# Patient Record
Sex: Female | Born: 1970 | Hispanic: Yes | Marital: Single | Smoking: Never smoker
Health system: Southern US, Community
[De-identification: ages and names within clinical notes are randomized; demographics above are authoritative.]

---

## 2004-11-14 ENCOUNTER — Emergency Department (HOSPITAL_COMMUNITY): Admission: EM | Admit: 2004-11-14 | Discharge: 2004-11-14 | Payer: Self-pay | Admitting: Emergency Medicine

## 2004-11-21 ENCOUNTER — Emergency Department (HOSPITAL_COMMUNITY): Admission: EM | Admit: 2004-11-21 | Discharge: 2004-11-21 | Payer: Self-pay | Admitting: Emergency Medicine

## 2006-12-11 ENCOUNTER — Ambulatory Visit: Payer: Self-pay | Admitting: Gynecology

## 2006-12-23 ENCOUNTER — Ambulatory Visit: Payer: Self-pay | Admitting: Gynecology

## 2006-12-23 ENCOUNTER — Other Ambulatory Visit: Admission: RE | Admit: 2006-12-23 | Discharge: 2006-12-23 | Payer: Self-pay | Admitting: Obstetrics and Gynecology

## 2007-01-06 ENCOUNTER — Ambulatory Visit: Payer: Self-pay | Admitting: Obstetrics & Gynecology

## 2007-07-07 ENCOUNTER — Ambulatory Visit: Payer: Self-pay | Admitting: Obstetrics and Gynecology

## 2007-07-07 ENCOUNTER — Encounter: Payer: Self-pay | Admitting: Obstetrics and Gynecology

## 2007-12-29 ENCOUNTER — Ambulatory Visit: Payer: Self-pay | Admitting: Obstetrics and Gynecology

## 2007-12-29 ENCOUNTER — Encounter: Payer: Self-pay | Admitting: Obstetrics and Gynecology

## 2010-06-07 ENCOUNTER — Encounter (INDEPENDENT_AMBULATORY_CARE_PROVIDER_SITE_OTHER): Payer: Self-pay | Admitting: Nurse Practitioner

## 2010-06-26 ENCOUNTER — Ambulatory Visit
Admission: RE | Admit: 2010-06-26 | Discharge: 2010-06-26 | Payer: Self-pay | Source: Home / Self Care | Attending: Nurse Practitioner | Admitting: Nurse Practitioner

## 2010-06-26 DIAGNOSIS — S99919A Unspecified injury of unspecified ankle, initial encounter: Secondary | ICD-10-CM

## 2010-06-26 DIAGNOSIS — S8990XA Unspecified injury of unspecified lower leg, initial encounter: Secondary | ICD-10-CM | POA: Insufficient documentation

## 2010-06-26 DIAGNOSIS — S99929A Unspecified injury of unspecified foot, initial encounter: Secondary | ICD-10-CM

## 2010-07-01 ENCOUNTER — Ambulatory Visit (HOSPITAL_COMMUNITY)
Admission: RE | Admit: 2010-07-01 | Discharge: 2010-07-01 | Payer: Self-pay | Source: Home / Self Care | Attending: Internal Medicine | Admitting: Internal Medicine

## 2010-07-03 ENCOUNTER — Encounter
Admission: RE | Admit: 2010-07-03 | Discharge: 2010-07-23 | Payer: Self-pay | Source: Home / Self Care | Attending: Internal Medicine | Admitting: Internal Medicine

## 2010-07-09 ENCOUNTER — Encounter (INDEPENDENT_AMBULATORY_CARE_PROVIDER_SITE_OTHER): Payer: Self-pay | Admitting: Internal Medicine

## 2010-07-15 ENCOUNTER — Encounter (INDEPENDENT_AMBULATORY_CARE_PROVIDER_SITE_OTHER): Payer: Self-pay | Admitting: Internal Medicine

## 2010-07-24 ENCOUNTER — Encounter: Payer: Self-pay | Admitting: Physical Therapy

## 2010-07-25 NOTE — Letter (Signed)
Summary: *HSN Results Follow up  [facility]  [address] [place], [place] [address]   Phone: [phone]  Fax: [phone]      07/15/2010   Jamie MARQUITA LIAS [address] [place], [place]  [address]   Dear  Ms. Jamie Friedman,                            ____S.Drinkard,FNP   ____D. Gore,FNP       ____B. McPherson,MD   ____V. Rankins,MD    __X__E. Shalita Notte,MD    ____N. Daphine Deutscher, FNP  ____D. Reche Dixon, MD    ____K. Philipp Deputy, MD    ____Other     This letter is to inform you that your recent test(s):  _______Pap Smear    _______Lab Test     ____X___X-ray    __X_____ is within acceptable limits  _______ requires a medication change  _______ requires a follow-up lab visit  _______ requires a follow-up visit with your provider   Comments:  Xrays of your ankle were normal.  Please call if you have not heard from physical therapy       _________________________________________________________ If you have any questions, please contact our office                     Sincerely,  Julieanne Manson MD [facility]

## 2010-07-25 NOTE — Letter (Signed)
Summary: work/school disposition notice  work/school disposition notice   Imported By: Arta Bruce 06/27/2010 14:53:28  _____________________________________________________________________  External Attachment:    Type:   Image     Comment:   External Document

## 2010-07-25 NOTE — Miscellaneous (Signed)
Summary: Rehab Report//INITIAL SUMMARY  Rehab Report//INITIAL SUMMARY   Imported By: Arta Bruce 07/09/2010 15:57:05  _____________________________________________________________________  External Attachment:    Type:   Image     Comment:   External Document

## 2010-07-25 NOTE — Assessment & Plan Note (Signed)
Summary: *new pt//mc   Vital Signs:  Patient profile:   40 year old female Menstrual status:  regular LMP:     06/16/2010 Height:      63 inches Weight:      146.31 pounds BMI:     26.01 Temp:     97 degrees F oral Pulse rate:   78 / minute Pulse rhythm:   regular Resp:     16 per minute BP sitting:   112 / 70  (left arm) Cuff size:   regular  Vitals Entered By: Hale Drone CMA (June 26, 2010 2:21 PM) CC: Here to establish. Right foot injury at work on 10/11. Went to an Urgent Care and was sent to PT. Has been told she should be walking by now but she doesn't understand why since she is in pain. She can't bare to move her foot or leg.  Is Patient Diabetic? No Pain Assessment Patient in pain? yes     Location: right foot Intensity: 7 Type: numbness Onset of pain  Constant  Does patient need assistance? Functional Status Self care Ambulation Normal LMP (date): 06/16/2010     Menstrual Status regular Enter LMP: 06/16/2010   CC:  Here to establish. Right foot injury at work on 10/11. Went to an Urgent Care and was sent to PT. Has been told she should be walking by now but she doesn't understand why since she is in pain. She can't bare to move her foot or leg. Marland Kitchen  History of Present Illness: 40 yo female here to establish.  1.  Rgiht foot pain:  Injured fight foot at work 03/29/10--dropped meat boxes on lateral foot and ankle, turned foot in as hit.   Went to [facility] on [address].  Had Xrays that were reportedly normal.  Was told to ice and elevate.  Was referred for PT and had 4 visits.  Cannot recall where the PT was done.  Pt. states the PT helped, but still having pain.  Apparently, were recommending extending the therapy.  Has not heard back from [facility].    Has pain all the time.  If bears weight, hurts more.  Still using a single crutch to walk.  No history of previous injury to ankle.  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 LIves with boyfriend Another female friend of boyfriend lives with them All children in [place]oved [place] in [place]. Tobacco:  no Alcohol:  no Drugs:  no  Physical Exam  Msk:  Almost full ROM of right foot and ankle, though much more pain with eversion. Mild swelling of soft tissue over lateral ankle and foot.  NT over anterior ankle mortise.   No crepitation or erythema Good DP and PT pulses.   Impression & Recommendations:  Problem # 1:  ANKLE INJURY, RIGHT (ICD-959.7)  Send for another Xray  Orders: Diagnostic X-Ray/Fluoroscopy (Diagnostic X-Ray/Flu) Physical Therapy Referral (PT)  Complete Medication List: 1)  Ibuprofen 600 Mg Tabs (Ibuprofen) .Marland Kitchen.. 1 tab by mouth every 6 hours as needed for pain.  take with food  Patient Instructions: 1)  Follow up with Dr. Delrae Alfred in 3 months --ankle pain Prescriptions: IBUPROFEN 600 MG TABS (IBUPROFEN) 1 tab  by mouth every 6 hours as needed for pain.  Take with food  #90 x 0   Entered and Authorized by:   Julieanne Manson MD   Signed by:   Julieanne Manson MD on 06/26/2010   Method used:   Electronically to        [facility] [phone]* (retail)       [address]       [place], [place]  [address]       Ph: [phone]       Fax: [phone]   RxID:   [id number]    Orders Added: 1)  New Patient Level II [99202] 2)  Diagnostic X-Ray/Fluoroscopy [Diagnostic X-Ray/Flu] 3)  Physical Therapy Referral [PT]   Not Administered:    Influenza Vaccine not given due to: declined

## 2010-07-29 ENCOUNTER — Ambulatory Visit: Payer: Self-pay | Attending: Internal Medicine | Admitting: Rehabilitative and Restorative Service Providers"

## 2010-07-29 DIAGNOSIS — M25569 Pain in unspecified knee: Secondary | ICD-10-CM | POA: Insufficient documentation

## 2010-07-29 DIAGNOSIS — M25669 Stiffness of unspecified knee, not elsewhere classified: Secondary | ICD-10-CM | POA: Insufficient documentation

## 2010-07-29 DIAGNOSIS — M25579 Pain in unspecified ankle and joints of unspecified foot: Secondary | ICD-10-CM | POA: Insufficient documentation

## 2010-07-29 DIAGNOSIS — IMO0001 Reserved for inherently not codable concepts without codable children: Secondary | ICD-10-CM | POA: Insufficient documentation

## 2010-07-31 ENCOUNTER — Ambulatory Visit: Payer: Self-pay | Admitting: Rehabilitative and Restorative Service Providers"

## 2010-08-05 ENCOUNTER — Ambulatory Visit: Payer: Self-pay | Admitting: Rehabilitative and Restorative Service Providers"

## 2010-08-07 ENCOUNTER — Ambulatory Visit: Payer: Self-pay | Admitting: Rehabilitative and Restorative Service Providers"

## 2010-08-12 ENCOUNTER — Encounter: Payer: Self-pay | Admitting: Physical Therapy

## 2010-08-14 ENCOUNTER — Encounter: Payer: Self-pay | Admitting: Physical Therapy

## 2010-08-15 ENCOUNTER — Ambulatory Visit: Payer: Self-pay | Admitting: Rehabilitative and Restorative Service Providers"

## 2010-08-15 ENCOUNTER — Encounter (INDEPENDENT_AMBULATORY_CARE_PROVIDER_SITE_OTHER): Payer: Self-pay | Admitting: Internal Medicine

## 2010-08-19 ENCOUNTER — Ambulatory Visit: Payer: Self-pay | Admitting: Rehabilitative and Restorative Service Providers"

## 2010-08-20 NOTE — Miscellaneous (Signed)
Summary: RENEWAL SUMMARY  RENEWAL SUMMARY   Imported By: Arta Bruce 08/15/2010 15:02:28  _____________________________________________________________________  External Attachment:    Type:   Image     Comment:   External Document

## 2010-08-21 ENCOUNTER — Encounter: Payer: Self-pay | Admitting: Physical Therapy

## 2010-08-26 ENCOUNTER — Ambulatory Visit: Payer: Self-pay | Attending: Internal Medicine | Admitting: Physical Therapy

## 2010-08-26 DIAGNOSIS — M25569 Pain in unspecified knee: Secondary | ICD-10-CM | POA: Insufficient documentation

## 2010-08-26 DIAGNOSIS — IMO0001 Reserved for inherently not codable concepts without codable children: Secondary | ICD-10-CM | POA: Insufficient documentation

## 2010-08-26 DIAGNOSIS — M25579 Pain in unspecified ankle and joints of unspecified foot: Secondary | ICD-10-CM | POA: Insufficient documentation

## 2010-08-28 ENCOUNTER — Ambulatory Visit: Payer: Self-pay | Admitting: Rehabilitative and Restorative Service Providers"

## 2010-08-30 ENCOUNTER — Encounter: Payer: Self-pay | Admitting: Internal Medicine

## 2010-08-30 ENCOUNTER — Encounter (INDEPENDENT_AMBULATORY_CARE_PROVIDER_SITE_OTHER): Payer: Self-pay | Admitting: Internal Medicine

## 2010-08-30 DIAGNOSIS — K056 Periodontal disease, unspecified: Secondary | ICD-10-CM | POA: Insufficient documentation

## 2010-08-30 DIAGNOSIS — K146 Glossodynia: Secondary | ICD-10-CM | POA: Insufficient documentation

## 2010-08-30 DIAGNOSIS — K069 Disorder of gingiva and edentulous alveolar ridge, unspecified: Secondary | ICD-10-CM

## 2010-09-02 ENCOUNTER — Ambulatory Visit: Payer: Self-pay | Admitting: Rehabilitative and Restorative Service Providers"

## 2010-09-03 NOTE — Assessment & Plan Note (Signed)
Summary: f/u on right ankle pain   Vital Signs:  Patient profile:   40 year old female Menstrual status:  regular LMP:     08/17/2010 Weight:      146.44 pounds Temp:     97.9 degrees F oral Pulse rate:   67 / minute Pulse rhythm:   regular Resp:     16 per minute BP sitting:   116 / 63  (left arm) Cuff size:   regular  Vitals Entered By: Hale Drone CMA (August 30, 2010 11:20 AM) CC: f/u on right ankle. Feels it has improved. Her ankle will swell in she walks for long periods. Has a PT appt. on 09/02/10..... Needing dental referral.  Is Patient Diabetic? No Pain Assessment Patient in pain? yes     Location: right ankle Intensity: 6  Does patient need assistance? Functional Status Self care Ambulation Normal LMP (date): 08/17/2010     Enter LMP: 08/17/2010   CC:  f/u on right ankle. Feels it has improved. Her ankle will swell in she walks for long periods. Has a PT appt. on 09/02/10..... Needing dental referral. .  History of Present Illness: 1.  Right ankle and foot is better.  Still going to PT and has an appt. on the 12th.  Did not get the report that her ankle films were normal--discussed.  2.  Dental concerns:  Lower incisors and right upper lateral teeth hurting after a teeth cleaning with a dental clinic.  Does have some gingival bleeding in those areas, but only with brushing.  Does not floss.  3.  Sore throat for months--feels like blisters on tongue as well.  No symptoms of a cold to initiate this.  No antibiotic when started.  Does not chew or use tobacco in any way  Current Medications (verified): 1)  Ibuprofen 600 Mg Tabs (Ibuprofen) .Marland Kitchen.. 1 Tab By Mouth Every 6 Hours As Needed For Pain.  Take With Food  Allergies (verified): No Known Drug Allergies  Physical Exam  General:  NAD Mouth:  Significant periodontal disease with heavy tartar at gum line involving all teeth.  No injection or inflammation, discharge of throat or tongue--no lesions as well Neck:   No deformities, masses, or tenderness noted.   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  Problem # 3:  GLOSSODYNIA (ICD-529.6) No findings  Complete Medication List: 1)  Ibuprofen 600 Mg Tabs (Ibuprofen) .Marland Kitchen.. 1 tab by mouth every 6 hours as needed for pain.  take with food  Patient Instructions: 1)  Call when you develop blisters on your tongue so I can see at the time.   Orders Added: 1)  Est. Patient Level III [82956]

## 2010-11-05 NOTE — Group Therapy Note (Signed)
Jamie Friedman, Jamie Friedman NO.:  [id number]   MEDICAL RECORD NO.:  [id number]          PATIENT TYPE:  WOC   LOCATION:  [facility]                   FACILITY:  [facility]   PHYSICIAN:  Argentina Donovan, MD        DATE OF BIRTH:  1971/03/31   DATE OF SERVICE:  12/29/2007                                  CLINIC NOTE   REASON FOR VISIT:  Annual exam.   SUBJECTIVE:  Patient has no particularly concerns today that she would  like to address.  She does request a refill on her Ortho Tri-Cyclen  birth control.  She states that this has been going well for her.   OBJECTIVE:  VITAL SIGNS:  Temperature 97.6, pulse 72, blood pressure  105/63, weight 142.7 pounds, height 63-1/2 inches.  BREASTS:  Breast tissue reveals very fibrous and dense breast tissue but  no appreciated lumps on examination.  No expressible discharge.  GYNECOLOGIC:  Speculum was inserted and no suspicious lesions were noted  on the cervix.  Pap smear was performed.  There was a small amount of  mucus and blood in the cervical os consistent with the patient getting  ready to start her period.  Bimanual exam revealed no adnexal or  cervical motion tenderness.  Uterus was normal size and no adnexal  masses were appreciated.   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  Pap smear was performed and follow-up will be  determined based on these results.  Likely if this is normal, she will  need her next Pap in one year.  Her Ortho Tri-Cyclen was refilled for  one year.  She will need referral for mammography after her next visit.     ______________________________  Argentina Donovan, MD    ______________________________  Argentina Donovan, MD    PR/MEDQ  D:  12/29/2007  T:  12/29/2007  Job:  [id number]

## 2011-03-18 ENCOUNTER — Other Ambulatory Visit: Payer: Self-pay | Admitting: Obstetrics and Gynecology

## 2011-03-18 DIAGNOSIS — Z1231 Encounter for screening mammogram for malignant neoplasm of breast: Secondary | ICD-10-CM

## 2011-03-27 ENCOUNTER — Ambulatory Visit (HOSPITAL_COMMUNITY)
Admission: RE | Admit: 2011-03-27 | Discharge: 2011-03-27 | Disposition: A | Discharge status: Home / Self Care | Payer: Self-pay | Source: Ambulatory Visit | Attending: Obstetrics and Gynecology | Admitting: Obstetrics and Gynecology

## 2011-03-27 DIAGNOSIS — Z1231 Encounter for screening mammogram for malignant neoplasm of breast: Secondary | ICD-10-CM

## 2011-04-08 LAB — POCT PREGNANCY, URINE: Operator id: 234181

## 2012-05-24 ENCOUNTER — Encounter: Payer: Self-pay | Admitting: Obstetrics & Gynecology

## 2012-05-27 ENCOUNTER — Other Ambulatory Visit: Payer: Self-pay | Admitting: Obstetrics & Gynecology

## 2012-05-27 DIAGNOSIS — Z1231 Encounter for screening mammogram for malignant neoplasm of breast: Secondary | ICD-10-CM

## 2012-06-09 ENCOUNTER — Encounter: Payer: Self-pay | Admitting: Obstetrics & Gynecology

## 2012-06-17 ENCOUNTER — Ambulatory Visit (HOSPITAL_COMMUNITY)
Admission: RE | Admit: 2012-06-17 | Discharge: 2012-06-17 | Disposition: A | Discharge status: Home / Self Care | Payer: Self-pay | Source: Ambulatory Visit | Attending: Obstetrics & Gynecology | Admitting: Obstetrics & Gynecology

## 2012-06-17 DIAGNOSIS — Z1231 Encounter for screening mammogram for malignant neoplasm of breast: Secondary | ICD-10-CM

## 2012-06-30 ENCOUNTER — Other Ambulatory Visit (HOSPITAL_COMMUNITY)
Admission: RE | Admit: 2012-06-30 | Discharge: 2012-06-30 | Disposition: A | Discharge status: Home / Self Care | Payer: Self-pay | Source: Ambulatory Visit | Attending: Obstetrics & Gynecology | Admitting: Obstetrics & Gynecology

## 2012-06-30 ENCOUNTER — Encounter: Payer: Self-pay | Admitting: Obstetrics & Gynecology

## 2012-06-30 ENCOUNTER — Ambulatory Visit (INDEPENDENT_AMBULATORY_CARE_PROVIDER_SITE_OTHER): Payer: Self-pay | Admitting: Obstetrics & Gynecology

## 2012-06-30 ENCOUNTER — Encounter: Payer: Self-pay | Admitting: *Deleted

## 2012-06-30 VITALS — BP 108/70 | HR 71 | Temp 97.9°F | Resp 20 | Ht 63.5 in | Wt 141.4 lb

## 2012-06-30 DIAGNOSIS — N841 Polyp of cervix uteri: Secondary | ICD-10-CM

## 2012-06-30 DIAGNOSIS — R87619 Unspecified abnormal cytological findings in specimens from cervix uteri: Secondary | ICD-10-CM | POA: Insufficient documentation

## 2012-06-30 DIAGNOSIS — Z309 Encounter for contraceptive management, unspecified: Secondary | ICD-10-CM

## 2012-06-30 MED ORDER — NORGESTIMATE-ETH ESTRADIOL 0.25-35 MG-MCG PO TABS: 1.0000 | ORAL_TABLET | Freq: Every day | ORAL | Status: DC

## 2012-06-30 NOTE — Progress Notes (Signed)
42 y.o. [id number] [facility] cervical polyp seen on exam on 05/05/12.  Normal pap smear, negative GC/Chlam.  Patient is Spanish-speaking only, Spanish interpreter present for this encounter.  She is very nervous.  BP 108/70  Pulse 71  Temp 97.9 F (36.6 C) (Oral)  Resp 20  Ht 5' 3.5" (1.613 m)  Wt 141 lb 6.4 oz (64.139 kg)  BMI 24.66 kg/m2  LMP 06/14/2012 5 mm friable polypoid lesion noted in the cervical os.  CERVICAL POLYPECTOMY NOTE Risks of the biopsy including pain, bleeding, infection, inadequate specimen, and need for additional procedures were discussed. The patient stated understanding and agreed to undergo procedure today. Consent was signed,  time out performed.  The patient's cervix was prepped with Betadine.  Ring forceps were used to grasp the polypoid lesion and the polypoid lesions was removed by twisting it off its base.  Tissue sent to pathology for analysis.  Small bleeding was noted and hemostasis was achieved using silver nitrate sticks.  The patient tolerated the procedure well. Post-procedure instructions were given to the patient.  Of note, patient asked for prescription for OCPs. No history of thromboembolism or other contraindications to OCPs. She was told [facility] can [facility] for lower prices but she insisted she wanted a prescription.  Sprintec prescribed.

## 2012-06-30 NOTE — Patient Instructions (Signed)
Return to clinic for any scheduled appointments or for any gynecologic concerns as needed.   

## 2012-07-05 ENCOUNTER — Telehealth: Payer: Self-pay | Admitting: *Deleted

## 2012-07-05 NOTE — Telephone Encounter (Signed)
Message copied by Mannie Stabile on Mon Jul 05, 2012  4:05 PM ------      Message from: Jaynie Collins A      Created: Sat Jul 03, 2012 11:24 AM       Benign cervical polyp. Please call patient withy Spanish interpreter to inform her of results. No follow up needed.

## 2012-07-05 NOTE — Telephone Encounter (Signed)
Called patient and her number is disconnected.

## 2012-07-06 NOTE — Telephone Encounter (Signed)
Patient called today.  Using [employer], Vanessa [place] gave information on benign cervical polyp - no cancer.  Patient stated understanding and was encouraged to call for an appointment if further issues developed.

## 2012-07-06 NOTE — Telephone Encounter (Signed)
Alternate number found in referral notes from [facility] [phone]. Called patient at this number but got no answer, no voicemail.

## 2012-10-27 ENCOUNTER — Emergency Department (HOSPITAL_COMMUNITY): Payer: No Typology Code available for payment source

## 2012-10-27 ENCOUNTER — Emergency Department (INDEPENDENT_AMBULATORY_CARE_PROVIDER_SITE_OTHER): Payer: No Typology Code available for payment source

## 2012-10-27 ENCOUNTER — Encounter (HOSPITAL_COMMUNITY): Payer: Self-pay

## 2012-10-27 ENCOUNTER — Emergency Department (HOSPITAL_COMMUNITY)
Admission: EM | Admit: 2012-10-27 | Discharge: 2012-10-27 | Disposition: A | Discharge status: Home / Self Care | Payer: No Typology Code available for payment source | Source: Home / Self Care

## 2012-10-27 ENCOUNTER — Emergency Department (HOSPITAL_COMMUNITY)
Admission: EM | Admit: 2012-10-27 | Discharge: 2012-10-27 | Disposition: A | Discharge status: Home Health | Payer: No Typology Code available for payment source | Source: Home / Self Care

## 2012-10-27 DIAGNOSIS — M7662 Achilles tendinitis, left leg: Secondary | ICD-10-CM

## 2012-10-27 DIAGNOSIS — M7652 Patellar tendinitis, left knee: Secondary | ICD-10-CM

## 2012-10-27 DIAGNOSIS — M7650 Patellar tendinitis, unspecified knee: Secondary | ICD-10-CM

## 2012-10-27 DIAGNOSIS — M7660 Achilles tendinitis, unspecified leg: Secondary | ICD-10-CM

## 2012-10-27 MED ORDER — MELOXICAM 7.5 MG PO TABS: 7.5000 mg | ORAL_TABLET | Freq: Two times a day (BID) | ORAL | Status: AC

## 2012-10-27 NOTE — ED Notes (Signed)
Patient complains of left foot swollen for almost a week Has gotten worse

## 2012-10-27 NOTE — ED Provider Notes (Signed)
History     CSN: [id number]  Arrival date & time 10/27/12  1456   None     Chief Complaint  Patient presents with  . Foot Swelling   42 year old female who presents with left ankle pain and left knee pain. The patient complains of pain around her achilles  tendon. The patient denies any trauma to her left leg. Pain in her left ankle started 2 weeks ago and pain in her left knee started 2 days ago. The patient denies any prior history of pain in that leg. She has not tried any NSAIDs or anti-inflammatory medications or over-the-counter medications.  HPI  History reviewed. No pertinent past medical history.  History reviewed. No pertinent past surgical history.  No family history on file.  History  Substance Use Topics  . Smoking status: Never Smoker   . Smokeless tobacco: Not on file  . Alcohol Use: No    OB History   Grav Para Term Preterm Abortions TAB SAB Ect Mult Living   6 6 6       6       Review of Systems Ankle pain and left knee pain Allergies  Review of patient's allergies indicates no known allergies.  Home Medications   Current Outpatient Rx  Name  Route  Sig  Dispense  Refill  . meloxicam (MOBIC) 7.5 MG tablet   Oral   Take 1 tablet (7.5 mg total) by mouth 2 (two) times daily.   30 tablet   2   . norgestimate-ethinyl estradiol (ORTHO-CYCLEN,SPRINTEC,PREVIFEM) 0.25-35 MG-MCG tablet   Oral   Take 1 tablet by mouth daily.   1 Package   11     BP 103/44  Pulse 74  Temp(Src) 97.7 F (36.5 C) (Oral)  Resp 18  SpO2 100%  Physical Exam Neck: Normal range of motion. Neck supple. No JVD present. No thyromegaly present.  Cardiovascular: Normal rate, regular rhythm, normal heart sounds and intact distal pulses.  Pulmonary/Chest: Effort normal and breath sounds normal. No stridor. No respiratory distress. She has no wheezes. She has no rales. She exhibits tenderness.  Abdominal: Soft. Bowel sounds are normal. She exhibits no distension and no mass.  There is no tenderness. There is no rebound and no guarding Musculoskeletal. The patient has pain around her left heel he is tender with good range of motion of the left ankle. She has no obvious swelling erythema of any of her joints including her left knee. She is good range of motion of the left knee. She is able to flex and extend her knee without having any pain  ED Course  Procedures (including critical care time)  Labs Reviewed - No data to display No results found.   1. Achilles tendinitis, left   2. Patellar tendinitis of left knee       MDM  Obtain x-rays of her left ankle and left knee today Anti-inflammatory medications and meloxicam 7.5 mg twice a day for 2 weeks We'll followup in 2 weeks If pain persists and the patient will need orthotics consult as well as shoe inserts arch support. She may also benefit from an orthopedic referral for that her Solu-Medrol injections to the tendon        Richarda Overlie, MD 10/27/12 1528

## 2012-10-27 NOTE — ED Notes (Signed)
Patient  Had left earlier in day unable to do x ray at that time Here for ankle x ray and knee x ray

## 2012-10-28 ENCOUNTER — Telehealth (HOSPITAL_COMMUNITY): Payer: Self-pay

## 2012-10-28 NOTE — ED Notes (Signed)
Tried to call patient to let her know about her appt for MRI Not available and no machine

## 2012-11-02 ENCOUNTER — Ambulatory Visit (HOSPITAL_COMMUNITY): Admission: RE | Admit: 2012-11-02 | Payer: No Typology Code available for payment source | Source: Ambulatory Visit

## 2013-01-05 ENCOUNTER — Ambulatory Visit: Payer: Self-pay

## 2013-01-14 ENCOUNTER — Ambulatory Visit: Payer: No Typology Code available for payment source | Attending: Family Medicine | Admitting: Internal Medicine

## 2013-01-14 VITALS — BP 115/67 | HR 71 | Temp 98.2°F | Resp 16 | Ht 65.0 in | Wt 141.0 lb

## 2013-01-14 DIAGNOSIS — R109 Unspecified abdominal pain: Secondary | ICD-10-CM | POA: Insufficient documentation

## 2013-01-14 LAB — POCT URINALYSIS DIPSTICK
Bilirubin, UA: NEGATIVE
Glucose, UA: NEGATIVE
Nitrite, UA: NEGATIVE
Urobilinogen, UA: 0.2
pH, UA: 5.5

## 2013-01-14 LAB — POCT URINE PREGNANCY: Preg Test, Ur: NEGATIVE

## 2013-01-14 MED ORDER — OMEPRAZOLE 40 MG PO CPDR: 40.0000 mg | DELAYED_RELEASE_CAPSULE | Freq: Every day | ORAL | Status: DC

## 2013-01-14 NOTE — Progress Notes (Signed)
Patient ID: Jamie Friedman, female   DOB: 25-Apr-1971, 42 y.o.   MRN: [id number] Patient Demographics  Jamie Friedman, is a 42 y.o. female  YQM:[id number]  BMW:[id number]  DOB - 04/21/1971  Chief Complaint  Patient presents with  . Abdominal Pain  . Establish Care        Subjective:   Jamie Friedman today is here for a follow up visit. Patient has No headache, No chest pain - No Nausea, No new weakness tingling or numbness, No Cough - SOB.  Patient reports that she's been having abdominal pain, right upper quadrant and epigastric, worse after eating and night when she lays down, also some burning sensation in the chest.  Last menstrual period 12/13/12.   Objective:    Filed Vitals:   01/14/13 1026  BP: 115/67  Pulse: 71  Temp: 98.2 F (36.8 C)  TempSrc: Oral  Resp: 16  Height: 5\' 5"  (1.651 m)  Weight: 141 lb (63.957 kg)  SpO2: 98%     ALLERGIES:  No Known Allergies  PAST MEDICAL HISTORY: History reviewed. No pertinent past medical history.  Past surgical history: None   Social history: - Denies any smoking or drug use. Occasional alcohol.  MEDICATIONS AT HOME: Prior to Admission medications   Medication Sig Start Date End Date Taking? Authorizing Provider  norgestimate-ethinyl estradiol (ORTHO-CYCLEN,SPRINTEC,PREVIFEM) 0.25-35 MG-MCG tablet Take 1 tablet by mouth daily. 06/30/12   Tereso Newcomer, MD  omeprazole (PRILOSEC) 40 MG capsule Take 1 capsule (40 mg total) by mouth daily. 01/14/13   Ripudeep Jenna Luo, MD     Exam  General appearance :Awake, alert, NAD, Speech Clear.  HEENT: Atraumatic and Normocephalic, PERLA Neck: supple, no JVD. No cervical lymphadenopathy.  Chest: Clear to auscultation bilaterally, no wheezing, rales or rhonchi CVS: S1 S2 regular, no murmurs.  Abdomen: soft, NBS, NT, ND, no gaurding, rigidity or rebound. Extremities: no cyanosis or clubbing, B/L Lower Ext shows no edema Neurology: Awake alert, and oriented X  3, CN II-XII intact, Non focal Skin: No Rash or lesions Wounds:N/A    Data Review   Basic Metabolic Panel: No results found for this basename: NA, K, CL, CO2, GLUCOSE, BUN, CREATININE, CALCIUM, MG, PHOS,  in the last 168 hours Liver Function Tests: No results found for this basename: AST, ALT, ALKPHOS, BILITOT, PROT, ALBUMIN,  in the last 168 hours  CBC: No results found for this basename: WBC, NEUTROABS, HGB, HCT, MCV, PLT,  in the last 168 hours  ------------------------------------------------------------------------------------------------------------------ No results found for this basename: HGBA1C,  in the last 72 hours ------------------------------------------------------------------------------------------------------------------ No results found for this basename: CHOL, HDL, LDLCALC, TRIG, CHOLHDL, LDLDIRECT,  in the last 72 hours ------------------------------------------------------------------------------------------------------------------ No results found for this basename: TSH, T4TOTAL, FREET3, T3FREE, THYROIDAB,  in the last 72 hours ------------------------------------------------------------------------------------------------------------------ No results found for this basename: VITAMINB12, FOLATE, FERRITIN, TIBC, IRON, RETICCTPCT,  in the last 72 hours  Coagulation profile  No results found for this basename: INR, PROTIME,  in the last 168 hours    Assessment & Plan   Active Problems: Abdominal pain: Epigastric/right upper quadrant worse on eating likely differential diagnosis gastritis/GERD/PUD or cholelithiasis - Advised patient to eat low fatty foods - Started patient on omeprazole 40 mg daily - Obtain abdominal ultrasound for further workup - Followup in one month for the ultrasound results and if improvement in her symptoms with omeprazole - Urine pregnancy test checked negative  Recommendations: Check CBC, BMET before next visit, labs ordered,  abdominal ultrasound ordered Health maintenance:  Will discuss next visit regarding PAP/immunizations. Follow-up in one month     RAI,RIPUDEEP M.D. 01/14/2013, 11:10 AM

## 2013-01-14 NOTE — Progress Notes (Signed)
Patient complains of lower abdomenal pain that started 3 months ago with fatigue sleepiness and states that she missed her last menstrual cycle and has been taking no birth control.

## 2013-02-14 ENCOUNTER — Encounter: Payer: Self-pay | Admitting: Internal Medicine

## 2013-02-14 ENCOUNTER — Ambulatory Visit: Payer: No Typology Code available for payment source | Attending: Family Medicine | Admitting: Internal Medicine

## 2013-02-14 ENCOUNTER — Ambulatory Visit: Payer: No Typology Code available for payment source

## 2013-02-14 VITALS — BP 101/66 | HR 76 | Temp 98.2°F | Resp 18 | Wt 144.0 lb

## 2013-02-14 DIAGNOSIS — R109 Unspecified abdominal pain: Secondary | ICD-10-CM

## 2013-02-14 DIAGNOSIS — K219 Gastro-esophageal reflux disease without esophagitis: Secondary | ICD-10-CM

## 2013-02-14 MED ORDER — DICYCLOMINE HCL 20 MG PO TABS: 20.0000 mg | ORAL_TABLET | Freq: Four times a day (QID) | ORAL | Status: DC

## 2013-02-14 MED ORDER — OMEPRAZOLE 40 MG PO CPDR: 40.0000 mg | DELAYED_RELEASE_CAPSULE | Freq: Every day | ORAL | Status: DC

## 2013-02-14 NOTE — Progress Notes (Signed)
Patient here for follow up-gerd

## 2013-02-14 NOTE — Progress Notes (Signed)
Patient ID: Jamie Friedman, female   DOB: 1971-06-15, 42 y.o.   MRN: [id number]  CC: Followup visit  HPI: Patient is a 42 years old woman with history of gastroesophageal reflux disease came in today for followup visit. No new complaint, she said that the abdominal pain is slightly better. 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 She denies chest pain, no cough, no shortness of breath. Her last menstrual period was the 28th of July, usually associated with some cramping, not related to this pain. No change in bowel habit, no urinary symptoms. No Known Allergies No past medical history on file. Current Outpatient Prescriptions on File Prior to Visit  Medication Sig Dispense Refill  . norgestimate-ethinyl estradiol (ORTHO-CYCLEN,SPRINTEC,PREVIFEM) 0.25-35 MG-MCG tablet Take 1 tablet by mouth daily.  1 Package  11   No current facility-administered medications on file prior to visit.   No family history on file. History   Social History  . Marital Status: Single    Spouse Name: N/A    Number of Children: N/A  . Years of Education: N/A   Occupational History  . Not on file.   Social History Main Topics  . Smoking status: Never Smoker   . Smokeless tobacco: Not on file  . Alcohol Use: No  . Drug Use: No  . Sexual Activity: Yes    Birth Control/ Protection: Condom   Other Topics Concern  . Not on file   Social History Narrative  . No narrative on file    Review of Systems: Constitutional: Negative for fever, chills, diaphoresis, activity change, appetite change and fatigue. HENT: Negative for ear pain, nosebleeds, congestion, facial swelling, rhinorrhea, neck pain, neck stiffness and ear discharge.  Eyes: Negative for pain, discharge, redness, itching and visual disturbance. Respiratory: Negative for cough, choking, chest tightness, shortness of breath, wheezing and stridor.  Cardiovascular: Negative for chest pain, palpitations and leg swelling. Gastrointestinal:  Negative for abdominal distention. Genitourinary: Negative for dysuria, urgency, frequency, hematuria, flank pain, decreased urine volume, difficulty urinating and dyspareunia.  Musculoskeletal: Negative for back pain, joint swelling, arthralgias and gait problem. Neurological: Negative for dizziness, tremors, seizures, syncope, facial asymmetry, speech difficulty, weakness, light-headedness, numbness and headaches.  Hematological: Negative for adenopathy. Does not bruise/bleed easily. Psychiatric/Behavioral: Negative for hallucinations, behavioral problems, confusion, dysphoric mood, decreased concentration and agitation.    Objective:   Filed Vitals:   02/14/13 0910  BP: 101/66  Pulse: 76  Temp: 98.2 F (36.8 C)  Resp: 18    Physical Exam: Constitutional: Patient appears well-developed and well-nourished. No distress. HENT: Normocephalic, atraumatic, External right and left ear normal. Oropharynx is clear and moist.  Eyes: Conjunctivae and EOM are normal. PERRLA, no scleral icterus. Neck: Normal ROM. Neck supple. No JVD. No tracheal deviation. No thyromegaly. CVS: RRR, S1/S2 +, no murmurs, no gallops, no carotid bruit.  Pulmonary: Effort and breath sounds normal, no stridor, rhonchi, wheezes, rales.  Abdominal: Soft. BS +,  no distension, tenderness, rebound or guarding.  Musculoskeletal: Normal range of motion. No edema and no tenderness.  Lymphadenopathy: No lymphadenopathy noted, cervical, inguinal or axillary Neuro: Alert. Normal reflexes, muscle tone coordination. No cranial nerve deficit. Skin: Skin is warm and dry. No rash noted. Not diaphoretic. No erythema. No pallor. Psychiatric: Normal mood and affect. Behavior, judgment, thought content normal.  No results found for this basename: WBC, HGB, HCT, MCV, PLT   No results found for this basename: CREATININE, BUN, NA, K, CL, CO2    No results found for  this basename: HGBA1C   Lipid Panel  No results found for this  basename: chol, trig, hdl, cholhdl, vldl, ldlcalc       Assessment and plan:   Patient Active Problem List   Diagnosis Date Noted  . GERD (gastroesophageal reflux disease) 02/14/2013  . Abdominal pain 02/14/2013  . Abdominal  pain, other specified site 01/14/2013  . PERIODONTAL DISEASE 08/30/2010  . GLOSSODYNIA 08/30/2010  . ANKLE INJURY, RIGHT 06/26/2010   Omeprazole 40 mg capsule by mouth daily Dicyclomine 20 mg tablet by mouth every 6 when necessary abdominal pain Patient counseled about nutrition and exercise  Complete urinalysis Abdominal and pelvic ultrasound Return to clinic in one month for follow up  Bolivar Haw was given clear instructions to go to ER or return to the clinic if symptoms don't improve, worsen or new problems develop.  Jamie Friedman verbalized understanding.  Jamie Friedman was told to call to get lab results if hasn't heard anything in the next week.         Jeanann Lewandowsky, MD [facility] [place], [place] [phone]   02/14/2013, 9:49 AM

## 2013-02-15 LAB — URINALYSIS, COMPLETE
Crystals: NONE SEEN
Ketones, ur: NEGATIVE mg/dL
Nitrite: POSITIVE — AB
Protein, ur: NEGATIVE mg/dL
Specific Gravity, Urine: 1.027 (ref 1.005–1.030)
Urobilinogen, UA: 0.2 mg/dL (ref 0.0–1.0)

## 2013-02-23 ENCOUNTER — Ambulatory Visit (HOSPITAL_COMMUNITY): Payer: No Typology Code available for payment source

## 2013-02-23 ENCOUNTER — Ambulatory Visit (HOSPITAL_COMMUNITY): Admission: RE | Admit: 2013-02-23 | Payer: No Typology Code available for payment source | Source: Ambulatory Visit

## 2013-02-28 ENCOUNTER — Ambulatory Visit (HOSPITAL_COMMUNITY): Payer: No Typology Code available for payment source

## 2013-03-03 ENCOUNTER — Ambulatory Visit (HOSPITAL_COMMUNITY)
Admission: RE | Admit: 2013-03-03 | Discharge: 2013-03-03 | Disposition: A | Discharge status: Home / Self Care | Payer: No Typology Code available for payment source | Source: Ambulatory Visit | Attending: Internal Medicine | Admitting: Internal Medicine

## 2013-03-03 ENCOUNTER — Telehealth: Payer: Self-pay

## 2013-03-03 DIAGNOSIS — R109 Unspecified abdominal pain: Secondary | ICD-10-CM | POA: Insufficient documentation

## 2013-03-03 DIAGNOSIS — N946 Dysmenorrhea, unspecified: Secondary | ICD-10-CM | POA: Insufficient documentation

## 2013-03-03 NOTE — Telephone Encounter (Signed)
Jamie Friedman Can u call patient and give her results  Thank you

## 2013-03-03 NOTE — Telephone Encounter (Signed)
Message copied by Lestine Mount on Thu Mar 03, 2013  1:42 PM ------      Message from: Jeanann Lewandowsky E      Created: Thu Mar 03, 2013  9:51 AM       Please call to inform patient that the abdominal ultrasound was normal ------

## 2013-03-03 NOTE — Telephone Encounter (Signed)
Jamie Friedman can you call patient and give her pap smear results Thank you

## 2013-03-03 NOTE — Telephone Encounter (Signed)
Message copied by Lestine Mount on Thu Mar 03, 2013  1:40 PM ------      Message from: Jeanann Lewandowsky E      Created: Thu Mar 03, 2013 12:35 PM       Please call patient to inform her that her abdominal ultrasound and transvaginal ultrasound showed thickening of the endometrial lining/uterine wall. This needs to be evaluated by a gynecologist       For this reason we'll refer her to gynecologist for biopsy of the uterine wall ------

## 2013-03-08 NOTE — Telephone Encounter (Signed)
Pt aware of the results  And the Dr need to put the gyn referral

## 2013-03-28 ENCOUNTER — Telehealth: Payer: Self-pay | Admitting: Internal Medicine

## 2013-03-28 ENCOUNTER — Telehealth: Payer: Self-pay | Admitting: Family Medicine

## 2013-03-28 NOTE — Telephone Encounter (Signed)
Pt called by spanish intepretor. No u/s results noted. Pt has f/u appt tomorrow, will discuss then.

## 2013-03-28 NOTE — Telephone Encounter (Signed)
We do not have patient's ultrasound result. Please call patient back to verify date of tests and where it was done

## 2013-03-28 NOTE — Telephone Encounter (Signed)
Patient has not been notified of ultrasound results from 03/03/13. Patient was told she would be given results three days after test. Please call patient with results. Patient speaks Spanish only.

## 2013-03-28 NOTE — Telephone Encounter (Signed)
Patient has not received results for ultrasound from 03/03/13. Patient was told results would be given three days after test. Please call patient with results. Patient only speaks [place].

## 2013-03-31 NOTE — Telephone Encounter (Signed)
Can you review [place] and advise  Patient has called for results

## 2013-03-31 NOTE — Telephone Encounter (Signed)
Arna Medici, Can you call the patient and let her know the abdominal US is normal Thank you

## 2013-03-31 NOTE — Telephone Encounter (Signed)
Abdominal US is negative, can we please let pt know. Thank you

## 2013-04-11 ENCOUNTER — Ambulatory Visit: Payer: No Typology Code available for payment source | Attending: Internal Medicine

## 2013-04-27 ENCOUNTER — Ambulatory Visit: Payer: No Typology Code available for payment source | Attending: Internal Medicine | Admitting: Internal Medicine

## 2013-04-27 ENCOUNTER — Encounter: Payer: Self-pay | Admitting: Internal Medicine

## 2013-04-27 VITALS — BP 103/67 | HR 68 | Temp 98.4°F | Resp 16 | Ht 65.0 in | Wt 142.0 lb

## 2013-04-27 DIAGNOSIS — Z0000 Encounter for general adult medical examination without abnormal findings: Secondary | ICD-10-CM

## 2013-04-27 DIAGNOSIS — R109 Unspecified abdominal pain: Secondary | ICD-10-CM

## 2013-04-27 DIAGNOSIS — K219 Gastro-esophageal reflux disease without esophagitis: Secondary | ICD-10-CM

## 2013-04-27 DIAGNOSIS — R102 Pelvic and perineal pain: Secondary | ICD-10-CM

## 2013-04-27 LAB — CBC WITH DIFFERENTIAL/PLATELET
Basophils Absolute: 0 10*3/uL (ref 0.0–0.1)
Basophils Relative: 0 % (ref 0–1)
Hemoglobin: 13.5 g/dL (ref 12.0–15.0)
Lymphs Abs: 2.6 10*3/uL (ref 0.7–4.0)
MCHC: 33.5 g/dL (ref 30.0–36.0)
Monocytes Relative: 7 % (ref 3–12)
Neutro Abs: 2.4 10*3/uL (ref 1.7–7.7)
Neutrophils Relative %: 45 % (ref 43–77)
RBC: 4.23 MIL/uL (ref 3.87–5.11)
WBC: 5.4 10*3/uL (ref 4.0–10.5)

## 2013-04-27 LAB — BASIC METABOLIC PANEL
CO2: 28 mEq/L (ref 19–32)
Glucose, Bld: 77 mg/dL (ref 70–99)
Potassium: 4.3 mEq/L (ref 3.5–5.3)
Sodium: 139 mEq/L (ref 135–145)

## 2013-04-27 LAB — TSH: TSH: 4.348 u[IU]/mL (ref 0.350–4.500)

## 2013-04-27 LAB — LIPID PANEL
Cholesterol: 163 mg/dL (ref 0–200)
HDL: 57 mg/dL (ref >39)
LDL Cholesterol: 93 mg/dL (ref 0–99)
Total CHOL/HDL Ratio: 2.9 Ratio

## 2013-04-27 NOTE — Progress Notes (Unsigned)
Patient ID: Jamie Friedman, female   DOB: [birth year], 42 y.o.   MRN: [id number]   HPI: This is a 42 year old female who comes in for a followup visit and test results. She was previously having abdominal pain an abdominal and pelvic ultrasound was ordered. She states that upper abdominal pain is no longer a major issue. She is not taking the below mentioned medications and occasionally takes a pill if she feels heartburn. In regards to pelvic pain she states it has also improved significantly.  No Known Allergies History reviewed. No pertinent past medical history. Prior to Admission medications   Medication Not taking  Sig Start Date End Date Taking? Authorizing Provider  dicyclomine (BENTYL) 20 MG tablet Take 1 tablet (20 mg total) by mouth every 6 (six) hours. 02/14/13   Jeanann Lewandowsky, MD  norgestimate-ethinyl estradiol (ORTHO-CYCLEN,SPRINTEC,PREVIFEM) 0.25-35 MG-MCG tablet Take 1 tablet by mouth daily. 06/30/12   Tereso Newcomer, MD  omeprazole (PRILOSEC) 40 MG capsule Take 1 capsule (40 mg total) by mouth daily. 02/14/13   Jeanann Lewandowsky, MD   History reviewed. No pertinent family history. History   Social History  . Marital Status: Single    Spouse Name: N/A    Number of Children: N/A  . Years of Education: N/A   Occupational History  . Not on file.   Social History Main Topics  . Smoking status: Never Smoker   . Smokeless tobacco: Not on file  . Alcohol Use: No  . Drug Use: No  . Sexual Activity: Yes    Birth Control/ Protection: Condom   Other Topics Concern  . Not on file   Social History Narrative  . No narrative on file    Review of Systems ______ Constitutional: Negative for fever, chills, diaphoresis, activity change, appetite change and fatigue. ____ HENT: Negative for ear pain, nosebleeds, congestion, facial swelling, rhinorrhea, neck pain, neck stiffness and ear discharge.  ____ Eyes: Negative for pain, discharge, redness, itching and visual  disturbance. ____ Respiratory: Negative for cough, choking, chest tightness, shortness of breath, wheezing and stridor.  ____ Cardiovascular: Negative for chest pain, palpitations and leg swelling. ____ Gastrointestinal: Negative for Nausea/ Vomiting/ Diarrhea or Consitpation Genitourinary: Negative for dysuria, urgency, frequency, hematuria, flank pain, decreased urine volume, difficulty urinating and dyspareunia. ____ Musculoskeletal: Negative for back pain, joint swelling, arthralgias and gait problem. ________ Neurological: Negative for dizziness, tremors, seizures, syncope, facial asymmetry, speech difficulty, weakness, light-headedness, numbness and headaches. ____ Hematological: Negative for adenopathy. Does not bruise/bleed easily. ____ Psychiatric/Behavioral: Negative for hallucinations, behavioral problems, confusion, dysphoric mood, decreased concentration and agitation. ______   Objective:   Filed Vitals:   04/27/13 0931  BP: 103/67  Pulse: 68  Temp: 98.4 F (36.9 C)  Resp: 16   Filed Weights   04/27/13 0931  Weight: 142 lb (64.411 kg)    Physical Exam ______ Constitutional: Appears well-developed and well-nourished. No distress. ____ HENT: Normocephalic. External right and left ear normal. Oropharynx is clear and moist. ____ Eyes: Conjunctivae and EOM are normal. PERRLA, no scleral icterus. ____ Neck: Normal ROM. Neck supple. No JVD. No tracheal deviation. No thyromegaly. ____ CVS: RRR, S1/S2 +, no murmurs, no gallops, no carotid bruit.  Pulmonary: Effort and breath sounds normal, no stridor, rhonchi, wheezes, rales.  Abdominal: Soft. BS +,  no distension, tenderness, rebound or guarding. ________ Musculoskeletal: Normal range of motion. No edema and no tenderness. ____ Neuro: Alert. Normal reflexes, muscle tone coordination. No cranial nerve deficit. Skin: Skin is warm and dry.  No rash noted. Not diaphoretic. No erythema. No pallor. ____ Psychiatric: Normal mood and  affect. Behavior, judgment, thought content normal. __  No results found for this basename: WBC, HGB, HCT, MCV, PLT   No results found for this basename: CREATININE, BUN, NA, K, CL, CO2    No results found for this basename: HGBA1C   Lipid Panel  No results found for this basename: chol, trig, hdl, cholhdl, vldl, ldlcalc        Patient Active Problem List   Diagnosis Date Noted  . GERD (gastroesophageal reflux disease) 02/14/2013  . Abdominal pain 02/14/2013  . Abdominal  pain, other specified site 01/14/2013  . PERIODONTAL DISEASE 08/30/2010  . GLOSSODYNIA 08/30/2010  . ANKLE INJURY, RIGHT 06/26/2010     Preventative Medicine:   Mammogram: Not yet due for this Pap Smear : Refer to GYN  Flu vaccine: Declined   LABS: Following have been ordered Ordered Metabolic panel: CBC:  Vitamin D : Lipid Panel: TSH:    Assessment and plan: Abdominal pain -Suspected to be from GERD -Continue to take when necessary medication as it is not severe  Pelvic pain -Ultrasound reveals hypertrophic endometrium for which I will refer to Gyn  Calvert Cantor, MD

## 2013-04-27 NOTE — Progress Notes (Unsigned)
Pt is here for a f/u visit. Pt last visit she was here with stomach pain. Today she is much better.

## 2013-04-28 LAB — VITAMIN D 25 HYDROXY (VIT D DEFICIENCY, FRACTURES): Vit D, 25-Hydroxy: 22 ng/mL — ABNORMAL LOW (ref 30–89)

## 2013-05-09 ENCOUNTER — Encounter (HOSPITAL_COMMUNITY): Payer: Self-pay | Admitting: Emergency Medicine

## 2013-05-09 ENCOUNTER — Emergency Department (HOSPITAL_COMMUNITY): Payer: No Typology Code available for payment source

## 2013-05-09 ENCOUNTER — Emergency Department (HOSPITAL_COMMUNITY)
Admission: EM | Admit: 2013-05-09 | Discharge: 2013-05-09 | Disposition: A | Discharge status: Home / Self Care | Payer: No Typology Code available for payment source | Attending: Emergency Medicine | Admitting: Emergency Medicine

## 2013-05-09 ENCOUNTER — Ambulatory Visit: Payer: No Typology Code available for payment source

## 2013-05-09 DIAGNOSIS — R079 Chest pain, unspecified: Secondary | ICD-10-CM | POA: Insufficient documentation

## 2013-05-09 DIAGNOSIS — M25519 Pain in unspecified shoulder: Secondary | ICD-10-CM | POA: Insufficient documentation

## 2013-05-09 DIAGNOSIS — R519 Headache, unspecified: Secondary | ICD-10-CM

## 2013-05-09 DIAGNOSIS — R51 Headache: Secondary | ICD-10-CM | POA: Insufficient documentation

## 2013-05-09 LAB — BASIC METABOLIC PANEL
CO2: 26 mEq/L (ref 19–32)
Calcium: 9.7 mg/dL (ref 8.4–10.5)
Chloride: 102 mEq/L (ref 96–112)
Glucose, Bld: 89 mg/dL (ref 70–99)
Potassium: 4.2 mEq/L (ref 3.5–5.1)
Sodium: 140 mEq/L (ref 135–145)

## 2013-05-09 LAB — CBC
Hemoglobin: 14.9 g/dL (ref 12.0–15.0)
MCH: 33.4 pg (ref 26.0–34.0)
MCV: 100 fL (ref 78.0–100.0)
RBC: 4.46 MIL/uL (ref 3.87–5.11)

## 2013-05-09 LAB — POCT I-STAT TROPONIN I

## 2013-05-09 MED ORDER — HYDROCODONE-ACETAMINOPHEN 5-325 MG PO TABS: 1.0000 | ORAL_TABLET | Freq: Four times a day (QID) | ORAL | Status: DC | PRN

## 2013-05-09 MED ORDER — IBUPROFEN 800 MG PO TABS
800.0000 mg | ORAL_TABLET | Freq: Once | ORAL | Status: AC
  Administered 2013-05-09: 800 mg via ORAL
  Filled 2013-05-09: qty 1

## 2013-05-09 NOTE — Progress Notes (Unsigned)
Pt comes in as walk-in with c/o 3 dys intermit left chest wall pain radiating to shoulder blade with left frontal headache/neck No distress noted 8/10 pain vss Report given to [facility]  Triage nurse

## 2013-05-09 NOTE — ED Notes (Signed)
Called times one no answer

## 2013-05-09 NOTE — ED Provider Notes (Signed)
CSN: [id number]     Arrival date & time 05/09/13  4098 History   First MD Initiated Contact with Patient 05/09/13 1148     Chief Complaint  Patient presents with  . Headache  . Chest Pain   (Consider location/radiation/quality/duration/timing/severity/associated sxs/prior Treatment) Patient is a 42 y.o. female presenting with headaches and chest pain. The history is provided by the patient (pt complains of a headache and some pain in left shoulder).  Headache Pain location:  Generalized Quality:  Dull Radiates to:  Does not radiate Severity currently:  4/10 Severity at highest:  5/10 Onset quality:  Gradual Timing:  Intermittent Progression:  Unchanged Chronicity:  New Context: not activity   Associated symptoms: no abdominal pain, no back pain, no congestion, no cough, no diarrhea, no fatigue, no seizures and no sinus pressure   Chest Pain Associated symptoms: headache   Associated symptoms: no abdominal pain, no back pain, no cough and no fatigue     History reviewed. No pertinent past medical history. History reviewed. No pertinent past surgical history. No family history on file. History  Substance Use Topics  . Smoking status: Never Smoker   . Smokeless tobacco: Not on file  . Alcohol Use: No   OB History   Grav Para Term Preterm Abortions TAB SAB Ect Mult Living   6 6 6       6      Review of Systems  Constitutional: Negative for appetite change and fatigue.  HENT: Negative for congestion, ear discharge and sinus pressure.   Eyes: Negative for discharge.  Respiratory: Negative for cough.   Cardiovascular: Positive for chest pain.  Gastrointestinal: Negative for abdominal pain and diarrhea.  Genitourinary: Negative for frequency and hematuria.  Musculoskeletal: Negative for back pain.  Skin: Negative for rash.  Neurological: Positive for headaches. Negative for seizures.  Psychiatric/Behavioral: Negative for hallucinations.    Allergies  Review of  patient's allergies indicates no known allergies.  Home Medications   Current Outpatient Rx  Name  Route  Sig  Dispense  Refill  . Multiple Vitamin (MULTIVITAMIN WITH MINERALS) TABS tablet   Oral   Take 1 tablet by mouth daily.         Marland Kitchen HYDROcodone-acetaminophen (NORCO/VICODIN) 5-325 MG per tablet   Oral   Take 1 tablet by mouth every 6 (six) hours as needed for moderate pain.   10 tablet   0    BP 113/58  Pulse 75  Temp(Src) 98 F (36.7 C) (Oral)  Resp 20  Wt 145 lb (65.772 kg)  SpO2 96%  LMP 05/03/2013 Physical Exam  Constitutional: She is oriented to person, place, and time. She appears well-developed.  HENT:  Head: Normocephalic.  Eyes: Conjunctivae and EOM are normal. No scleral icterus.  Neck: Neck supple. No thyromegaly present.  Cardiovascular: Normal rate and regular rhythm.  Exam reveals no gallop and no friction rub.   No murmur heard. Pulmonary/Chest: No stridor. She has no wheezes. She has no rales. She exhibits no tenderness.  Abdominal: She exhibits no distension. There is no tenderness. There is no rebound.  Musculoskeletal: Normal range of motion. She exhibits no edema.  Lymphadenopathy:    She has no cervical adenopathy.  Neurological: She is oriented to person, place, and time. She exhibits normal muscle tone. Coordination normal.  Skin: No rash noted. No erythema.  Psychiatric: She has a normal mood and affect. Her behavior is normal.    ED Course  Procedures (including critical care time) Labs Review  Labs Reviewed  CBC - Abnormal; Notable for the following:    Platelets 106 (*)    All other components within normal limits  BASIC METABOLIC PANEL - Abnormal; Notable for the following:    GFR calc non Af Amer 83 (*)    All other components within normal limits  POCT I-STAT TROPONIN I   Imaging Review Ct Head Wo Contrast  05/09/2013   CLINICAL DATA:  Three-day history of frontal headache  EXAM: CT HEAD WITHOUT CONTRAST  TECHNIQUE:  Contiguous axial images were obtained from the base of the skull through the vertex without intravenous contrast. Study was obtained within 24 hr of patient's arrival at the emergency department.  COMPARISON:  None.  FINDINGS: The ventricles are normal in size and configuration. There is no mass, hemorrhage, extra-axial fluid collection, or midline shift. There is a punctate calcification in the medial right frontal lobe which probably represents a small granuloma. The gray-white compartments are otherwise normal. There is no demonstrable acute infarct.  Bony calvarium appears intact.  Mastoid air cells are clear.  IMPRESSION: Probable small calcified granuloma in the right medial frontal lobe. No hemorrhage or mass. Elsewhere gray-white compartments are normal.   Electronically Signed   By: Bretta Bang M.D.   On: 05/09/2013 14:40    EKG Interpretation    Date/Time:  Monday May 09 2013 10:22:57 EST Ventricular Rate:  71 PR Interval:  98 QRS Duration: 82 QT Interval:  350 QTC Calculation: 380 R Axis:   83 Text Interpretation:  Sinus rhythm with short PR Otherwise normal ECG            MDM   1. Headache        Benny Lennert, MD 05/09/13 1515

## 2013-05-09 NOTE — ED Notes (Signed)
Pt discharged.Vital signs stable and GCS 15.Discharge instruction given. 

## 2013-05-09 NOTE — ED Notes (Signed)
Pt speaks Spanish and per interpretor phone patient is here with complaints with pain to left head, back, ear, and her left chest.  Pt was sent from clinic.  Pt reports 3 day of pain, headache started last nite, chest pain this am. Pt states intermittent left facial numbness.  No sob.  Pt reports pain to left eye sometimes

## 2013-06-06 ENCOUNTER — Ambulatory Visit (INDEPENDENT_AMBULATORY_CARE_PROVIDER_SITE_OTHER): Payer: No Typology Code available for payment source | Admitting: Obstetrics & Gynecology

## 2013-06-06 ENCOUNTER — Encounter: Payer: Self-pay | Admitting: Obstetrics & Gynecology

## 2013-06-06 VITALS — BP 95/61 | HR 72 | Temp 98.1°F | Ht 65.0 in | Wt 142.5 lb

## 2013-06-06 DIAGNOSIS — N946 Dysmenorrhea, unspecified: Secondary | ICD-10-CM

## 2013-06-06 MED ORDER — IBUPROFEN 600 MG PO TABS: 600.0000 mg | ORAL_TABLET | Freq: Four times a day (QID) | ORAL | Status: AC | PRN

## 2013-06-06 NOTE — Progress Notes (Signed)
Patient ID: Jamie Friedman, female   DOB: 1970-08-11, 42 y.o.   MRN: [id number]  Chief Complaint  Patient presents with  . Referral    from [facility]  . Gynecologic Exam    HPI Jamie Friedman is a 42 y.o. female.  Patient's last menstrual period was 06/03/2013. [id number]  Heavy menses on day 2 of her cycle with cramps. Regular menses, uses condoms, declines Mirena or OCP HPI  History reviewed. No pertinent past medical history.  History reviewed. No pertinent past surgical history.  History reviewed. No pertinent family history.  Social History History  Substance Use Topics  . Smoking status: Never Smoker   . Smokeless tobacco: Not on file  . Alcohol Use: No    No Known Allergies  Current Outpatient Prescriptions  Medication Sig Dispense Refill  . Multiple Vitamin (MULTIVITAMIN WITH MINERALS) TABS tablet Take 1 tablet by mouth daily.      Marland Kitchen ibuprofen (ADVIL,MOTRIN) 600 MG tablet Take 1 tablet (600 mg total) by mouth every 6 (six) hours as needed.  30 tablet  1   No current facility-administered medications for this visit.    Review of Systems Review of Systems  Constitutional: Negative.   Gastrointestinal: Negative for nausea and abdominal distention.  Genitourinary: Positive for menstrual problem. Negative for dysuria, vaginal bleeding and vaginal discharge.    Blood pressure 95/61, pulse 72, temperature 98.1 F (36.7 C), temperature source Oral, height 5\' 5"  (1.651 m), weight 142 lb 8 oz (64.638 kg), last menstrual period 06/03/2013.  Physical Exam Physical Exam  Constitutional: She appears well-developed. No distress.  Abdominal: Soft. There is no tenderness. There is no guarding.  Genitourinary: Vagina normal and uterus normal. No vaginal discharge found.  No mass, not tender    Data Reviewed CLINICAL DATA: Dysmenorrhea  EXAM:  TRANSABDOMINAL AND TRANSVAGINAL ULTRASOUND OF PELVIS  TECHNIQUE:  Both transabdominal and  transvaginal ultrasound examinations of the  pelvis were performed. Transabdominal technique was performed for  global imaging of the pelvis including uterus, ovaries, adnexal  regions, and pelvic cul-de-sac. It was necessary to proceed with  endovaginal exam following the transabdominal exam to visualize the  ovaries and adnexa to better advantage.  COMPARISON: None  FINDINGS:  Uterus  Measurements: 7.2 cm x 4.2 cm x 5.3 cm No fibroids or other mass  visualized.  Endometrium  Thickness: 17.3 mm endometrium is thickened with mixed echogenicity,  but no discrete mass and no endometrial fluid.  Right ovary  Measurements: 18 mm x 14 mm x 22 mm Normal appearance/no adnexal  mass.  Left ovary  Measurements: 18 mm x 14 mm x 25 mm Normal appearance/no adnexal  mass.  Other findings  Trace, presumed physiologic, free fluid.  IMPRESSION:  The endometrium is heterogeneous and thickened consistent with  endometrial hyperplasia. No discrete endometrial mass is seen.  Normal ovaries and adnexa.  Electronically Signed  By: Amie Portland  On: 03/03/2013 10:10   Assessment    Dysmenorrhea and menorrhagia, uses condoms, declines OCP or Mirena     Plan    Ibuprofen 600 mg Q 6-8 hr during menses  Report if not improved        Delaina Fetsch 06/06/2013, 2:13 PM

## 2013-06-06 NOTE — Patient Instructions (Signed)
Dismenorrea  (Dysmenorrhea)  Los cólicos menstruales (dismenorrea) se originan en los espasmos musculares del útero (contracciones) durante el período menstrual. En algunas mujeres, el malestar es sólo una molestia. En otras, la dismenorrea puede ser tan intensa que interfiere con las actividades diarias durante algunos días, todos los meses.  La dismenorrea primaria son los cólicos menstruales que duran algunos días al inicio de los períodos o poco después. En general se inician después de que la adolescente comienza a tener sus períodos. A medida que la mujer avanza en edad, o tiene un bebé, los cólicos generalmente disminuyen o desaparecen. La dismenorrea secundaria comienza en épocas posteriores de la vida, dura más tiempo, y el dolor puede ser más intenso. El dolor puede comenzar antes del período y durar hasta algunos días después.   CAUSAS   La causa de la dismenorrea generalmente es un problema subyacente, por ejemplo:  · El tejido que recubre interiormente al útero crece fuera del útero en otras áreas del cuerpo (endometriosis).  · The endometrial tissue, which normally lines the uterus, is found in or grows into the muscular walls of the uterus (adenomyosis).  · Los vasos sanguíneos de la pelvis se congestionan con sangre antes del período menstrual (síndrome congestivo pélvico).  · Crecimiento excesivo de las células (pólipos) del revestimiento interior del útero o del cuello uterino.  · Caída del útero (prolapso) debido a distensión o flojedad de los ligamentos.  · Depresión.  · Problemas como infección o inflamación en la vejiga.  · Problemas en el intestino, un tumor, o síndrome de colon irritable.  · Cáncer de los órganos femeninos o en la vejiga.  · Un útero muy inclinado.  · Una apertura muy estrecha o el cuello uterino cerrado.  · Tumores no cancerosos en el útero (fibromas).  · Enfermedad pélvica inflamatoria (EPI)  · Cicatrices en la pelvis (adherencias) por cirugías previas.  · Quiste  ovárico  · El uso del dispositivo intrauterino (DIU) como método anticonceptivo.  FACTORES DE RIESGO  Puede tener más riesgo de dismenorrea si:  · Tiene menos de 30 años.  · La pubertad se inició temprano.  · Tiene sangrado irregular o abundante.  · No ha tenido hijos.  · Tiene una historia familiar de este problema.  · Es fumadora.  SIGNOS Y SÍNTOMAS   · Cólicos y dolor punzante en la zona inferior del abdomen.  · Dolores de cabeza.  · Dolor de cintura.  · Náuseas o vómitos.  · Diarrea.  · Sudoración o mareos.  · Heces blandas.  DIAGNÓSTICO   El diagnóstico se basa en la historia personal, los síntomas, el examen físico, las pruebas diagnósticas o procedimientos. Las pruebas diagnósticas o procedimientos pueden ser:  · Análisis de sangre.  · Ecografías.  · Examen de las capas que cubren el útero (dilatación y curetaje, D&C).  · Examen de la zona interna del abdomen o la pelvis con un laparoscopio.  · Radiografías.  · Tomografía computada.  · Resonancia magnética.  · Un examen del interior de la vejiga con un citoscopio.  · Examen del interior del intestino o el estómago con un dispositivo especial (colonoscopio o un gastroscopio).  TRATAMIENTO   El tratamiento depende de la causa de la dismenorrea. El tratamiento puede incluir:  · Analgésicos indicados por su médico.  · Píldoras anticonceptivas o un DIU con progesterona.  · Terapia de reemplazo hormonal.  · Antiinflamatorios no esteroides (AINE). Los antiinflamatorios pueden detener la producción de prostaglandinas.  · Cirugía para extirpar   las adherencias, endometriosis, quiste de ovarios o fibromas.  · Extirpación del útero (histerectomía).  · Inyecciones de progesterona para detener el período menstrual.  · Corte de los nervios del sacro que van hacia los órganos femeninos (neurectomía presacra).  · Corriente eléctrica en los nervios sacros (estimulación de los nervios sacros).  · Medicamentos antidepresivos.  · Terapia psiquiátrica, psicoterapia individual o de  grupo.  · Actividad física y fisioterapia.  · Meditación y yoga.  · Acupuntura.  INSTRUCCIONES PARA EL CUIDADO EN EL HOGAR   · Tome sólo medicamentos de venta libre o recetados, según las indicaciones del médico.  · Coloque una almohadilla eléctrica o una botella con agua caliente en la zona inferior del abdomen. No duerma con la almohadilla térmica.  · Haga ejercicios aeróbicos como caminar, nadar, andar en bicicleta, para aliviar los cólicos.  · Masajear la zona inferior de la espalda o el abdomen puede ser de ayuda.  · Deje de fumar.  · Evite la cafeína y el alcohol.  SOLICITE ATENCIÓN MÉDICA SI:   · El dolor no mejora con los medicamentos recetados.  · Tiene dolor durante las relaciones sexuales.  · El dolor aumenta y no puede controlarlo con los medicamentos.  · Observa una hemorragia vaginal anormal con el período.  · Tiene náuseas o vómitos con el período menstrual que no puede controlar con medicamentos.  SOLICITE ATENCIÓN MÉDICA DE INMEDIATO SI:   Se desmaya.   Document Released: 03/19/2005 Document Revised: 02/09/2013  ExitCare® Patient Information ©2014 [employer].

## 2013-06-27 ENCOUNTER — Ambulatory Visit: Payer: No Typology Code available for payment source | Attending: Internal Medicine

## 2013-08-22 ENCOUNTER — Encounter: Payer: Self-pay | Admitting: Internal Medicine

## 2013-08-22 ENCOUNTER — Ambulatory Visit: Payer: No Typology Code available for payment source | Attending: Internal Medicine | Admitting: Internal Medicine

## 2013-08-22 VITALS — BP 112/68 | HR 77 | Temp 99.0°F | Resp 16 | Ht 65.0 in | Wt 138.0 lb

## 2013-08-22 DIAGNOSIS — K089 Disorder of teeth and supporting structures, unspecified: Secondary | ICD-10-CM | POA: Insufficient documentation

## 2013-08-22 DIAGNOSIS — K0889 Other specified disorders of teeth and supporting structures: Secondary | ICD-10-CM

## 2013-08-22 DIAGNOSIS — K029 Dental caries, unspecified: Secondary | ICD-10-CM

## 2013-08-22 DIAGNOSIS — Z09 Encounter for follow-up examination after completed treatment for conditions other than malignant neoplasm: Secondary | ICD-10-CM | POA: Insufficient documentation

## 2013-08-22 MED ORDER — IBUPROFEN 600 MG PO TABS: 600.0000 mg | ORAL_TABLET | Freq: Three times a day (TID) | ORAL | Status: DC | PRN

## 2013-08-22 MED ORDER — AMOXICILLIN-POT CLAVULANATE 875-125 MG PO TABS: 1.0000 | ORAL_TABLET | Freq: Two times a day (BID) | ORAL | Status: DC

## 2013-08-22 NOTE — Progress Notes (Signed)
MRN: [id number] Name: Jamie Friedman  Sex: female Age: 43 y.o. DOB: 04/11/1971  Allergies: Review of patient's allergies indicates no known allergies.  Chief Complaint  Patient presents with  . Follow-up    HPI: Patient is 43 y.o. female who comes today reported to have lot of dental pain for the last 3-4 days, she complaining of chills but denies any fever, she took over-the-counter pain medication with some improvement, she also has dental caries, requesting to see a dentist.  History reviewed. No pertinent past medical history.  History reviewed. No pertinent past surgical history.    Medication List       This list is accurate as of: 08/22/13  2:41 PM.  Always use your most recent med list.               amoxicillin-clavulanate 875-125 MG per tablet  Commonly known as:  AUGMENTIN  Take 1 tablet by mouth 2 (two) times daily.     ibuprofen 600 MG tablet  Commonly known as:  ADVIL,MOTRIN  Take 1 tablet (600 mg total) by mouth every 6 (six) hours as needed.     ibuprofen 600 MG tablet  Commonly known as:  ADVIL,MOTRIN  Take 1 tablet (600 mg total) by mouth every 8 (eight) hours as needed.     multivitamin with minerals Tabs tablet  Take 1 tablet by mouth daily.        Meds ordered this encounter  Medications  . amoxicillin-clavulanate (AUGMENTIN) 875-125 MG per tablet    Sig: Take 1 tablet by mouth 2 (two) times daily.    Dispense:  20 tablet    Refill:  0  . ibuprofen (ADVIL,MOTRIN) 600 MG tablet    Sig: Take 1 tablet (600 mg total) by mouth every 8 (eight) hours as needed.    Dispense:  30 tablet    Refill:  1     There is no immunization history on file for this patient.  History reviewed. No pertinent family history.  History  Substance Use Topics  . Smoking status: Never Smoker   . Smokeless tobacco: Not on file  . Alcohol Use: No    Review of Systems   As noted in HPI  Filed Vitals:   08/22/13 1428  BP: 112/68  Pulse:  77  Temp: 99 F (37.2 C)  Resp: 16    Physical Exam  Physical Exam  HENT:  Dental cavities  Eyes: EOM are normal. Pupils are equal, round, and reactive to light.  Cardiovascular: Normal rate and regular rhythm.   Pulmonary/Chest: Breath sounds normal. No respiratory distress. She has no wheezes. She has no rales.    CBC    Component Value Date/Time   WBC 6.9 05/09/2013 1132   RBC 4.46 05/09/2013 1132   HGB 14.9 05/09/2013 1132   HCT 44.6 05/09/2013 1132   PLT 106* 05/09/2013 1132   MCV 100.0 05/09/2013 1132   LYMPHSABS 2.6 04/27/2013 0948   MONOABS 0.4 04/27/2013 0948   EOSABS 0.1 04/27/2013 0948   BASOSABS 0.0 04/27/2013 0948    CMP     Component Value Date/Time   NA 140 05/09/2013 1132   K 4.2 05/09/2013 1132   CL 102 05/09/2013 1132   CO2 26 05/09/2013 1132   GLUCOSE 89 05/09/2013 1132   BUN 14 05/09/2013 1132   CREATININE 0.84 05/09/2013 1132   CREATININE 0.79 04/27/2013 0948   CALCIUM 9.7 05/09/2013 1132   GFRNONAA 83* 05/09/2013 1132   GFRAA >  90 05/09/2013 1132    Lab Results  Component Value Date/Time   CHOL 163 04/27/2013  9:48 AM    No components found with this basename: hga1c    No results found for this basename: AST    Assessment and Plan  Pain, dental - Plan: amoxicillin-clavulanate (AUGMENTIN) 875-125 MG per tablet, ibuprofen (ADVIL,MOTRIN) 600 MG tablet, Ambulatory referral to Dentistry  Dental cavities - Plan: Ambulatory referral to Dentistry   Return if symptoms worsen or fail to improve.  Doris CheadleADVANI, Criston Chancellor, MD

## 2013-08-22 NOTE — Progress Notes (Signed)
Pt is here having pain in her teeth. She states that some teeth are loose and making it difficult to eat.

## 2013-12-12 ENCOUNTER — Emergency Department (HOSPITAL_COMMUNITY)
Admission: EM | Admit: 2013-12-12 | Discharge: 2013-12-12 | Disposition: A | Discharge status: Home / Self Care | Payer: No Typology Code available for payment source | Attending: Emergency Medicine | Admitting: Emergency Medicine

## 2013-12-12 ENCOUNTER — Encounter (HOSPITAL_COMMUNITY): Payer: Self-pay | Admitting: Emergency Medicine

## 2013-12-12 DIAGNOSIS — R21 Rash and other nonspecific skin eruption: Secondary | ICD-10-CM | POA: Insufficient documentation

## 2013-12-12 DIAGNOSIS — Z792 Long term (current) use of antibiotics: Secondary | ICD-10-CM | POA: Insufficient documentation

## 2013-12-12 MED ORDER — HYDROCORTISONE 1 % EX CREA: TOPICAL_CREAM | CUTANEOUS | Status: DC

## 2013-12-12 NOTE — ED Notes (Signed)
She states shes had a sore throat and rash on her neck for past month.

## 2013-12-12 NOTE — ED Provider Notes (Signed)
Medical screening examination/treatment/procedure(s) were performed by non-physician practitioner and as supervising physician I was immediately available for consultation/collaboration.   EKG Interpretation None        Forrest S Harrison, MD 12/12/13 1922 

## 2013-12-12 NOTE — Discharge Instructions (Signed)
Please apply hydrocortisone cream to rash on your upper chest twice daily for 5 days.  If no improvement, follow up with your doctor for further care.    Erupcin cutnea (Rash)  Una erupcin es un cambio en el color o en la textura de la piel. Hay diferentes tipos de erupcin. Puede ser que tenga otros sntomas que acompaan la erupcin.  CAUSAS   Infecciones.  Reacciones alrgicas. Esto incluye alergias a mascotas o a medicamentos.  Ciertos medicamentos.  Exposicin a ciertas sustancias qumicas, jabones o cosmticos.  El calor.  Exposicin a plantas venenosas.  Tumores, tanto cancerosos como no cancerosos. SNTOMAS   Enrojecimiento.  Piel escamosa.  Picazn en la piel.  Piel seca o agrietada.  Bultos.  Ampollas.  Dolor. DIAGNSTICO  El mdico har un examen fsico para determinar qu tipo de erupcin tiene. Podrn tomarle una muestra de piel (biopsia) para ser examinada en el microscopio.  TRATAMIENTO  El tratamiento depende del tipo de erupcin que usted tenga. El mdico puede prescribirle algunos medicamentos. En los casos graves, Pension scheme managernecesitar ver a un mdico Engineer, productionespecialista en piel (dermatlogo).  INSTRUCCIONES PARA EL CUIDADO DOMICILIARIO  Evite las sustancias que han causado la erupcin.  No se rasque la lesin. Puede ocasionarle una infeccin.  Tome baos con agua fresca para Psychologist, sport and exercisedetener la picazn.  Tome slo medicamentos de venta libre o recetados, segn las indicaciones del mdico.  Cumpla con todas las visitas de control, segn le indique su mdico. SOLICITE ATENCIN MDICA DE INMEDIATO SI:  El dolor, la hinchazn o el enrojecimiento Creeksideaumentan.  Tiene fiebre.  Tiene sntomas nuevos o graves.  Siente dolor en el cuerpo, diarrea o vmitos.  La erupcin no mejora en el trmino de 3 das. ASEGRESE DE QUE:   Comprende estas instrucciones.  Controlar su enfermedad.  Solicitar ayuda de inmediato si no mejora o si empeora. Document Released:  03/19/2005 Document Revised: 03/03/2012 [facility] CampusExitCare Patient Information 2015 [place]aiExitCare, MarylandLL[place]. This information is not intended to replace advice given to you by your health care provider. Make sure you discuss any questions you have with your health care provider.

## 2013-12-12 NOTE — ED Notes (Signed)
Itchy rash on neck x 1 month per translator phones no new jewelry clothes, did use nes shampoo but that was after rash started . Rash is itchy no other family has it no new pets does not have rash anywhere else

## 2013-12-12 NOTE — ED Notes (Signed)
D/c instructions per translator phones pt understands all and has no questions Child psychotherapistsocial worker in there now

## 2013-12-12 NOTE — ED Provider Notes (Signed)
CSN: [id number]     Arrival date & time 12/12/13  [id number] History  This chart was scribed for non-physician practitioner, Fayrene HelperBowie Tran, PA-C working with Junius ArgyleForrest S Harrison, MD by Greggory StallionKayla Andersen, ED scribe. This patient was seen in room TR09C/TR09C and the patient's care was started at 9:41 AM.    Chief Complaint  Patient presents with  . Rash   The history is provided by the patient. A language interpreter was used.   HPI Comments: [name] is a 43 y.o. female who presents to the Emergency Department complaining gradual onset itchy, burning rash to her neck that started one month ago. Also reports an intermittent stabbing pain in her throat. Denies any pain with swallowing. Pt has taken advil with no relief. No one else at home has a similar rash. Denies new lotions, detergents, pets or medications but states she switched to a different shampoo after the rash started. States she has not worn any new necklaces. Pt does not have a rash anywhere else. Denies fever, chills, rhinorrhea, sneezing, trouble breathing, SOB, cough, chest pain. Pt has not seen her PCP for these symptoms yet. No complaint of headache.  History reviewed. No pertinent past medical history. History reviewed. No pertinent past surgical history. No family history on file. History  Substance Use Topics  . Smoking status: Never Smoker   . Smokeless tobacco: Not on file  . Alcohol Use: No   OB History   Grav Para Term Preterm Abortions TAB SAB Ect Mult Living   6 6 6       6      Review of Systems  Constitutional: Negative for fever and chills.  HENT: Negative for rhinorrhea, sneezing and sore throat.   Respiratory: Negative for cough and shortness of breath.   Cardiovascular: Negative for chest pain.  Skin: Positive for rash.  All other systems reviewed and are negative.  Allergies  Review of patient's allergies indicates no known allergies.  Home Medications   Prior to Admission medications    Medication Sig Start Date End Date Taking? Authorizing Karim Aiello  amoxicillin-clavulanate (AUGMENTIN) 875-125 MG per tablet Take 1 tablet by mouth 2 (two) times daily. 08/22/13   Doris Cheadleeepak Advani, MD  ibuprofen (ADVIL,MOTRIN) 600 MG tablet Take 1 tablet (600 mg total) by mouth every 6 (six) hours as needed. 06/06/13   Adam PhenixJames G Arnold, MD  ibuprofen (ADVIL,MOTRIN) 600 MG tablet Take 1 tablet (600 mg total) by mouth every 8 (eight) hours as needed. 08/22/13   Doris Cheadleeepak Advani, MD  Multiple Vitamin (MULTIVITAMIN WITH MINERALS) TABS tablet Take 1 tablet by mouth daily.    Historical Shontavia Mickel, MD   BP 110/52  Pulse 82  Resp 16  SpO2 100%  Physical Exam  Nursing note and vitals reviewed. Constitutional: She is oriented to person, place, and time. She appears well-developed and well-nourished. No distress.  HENT:  Head: Normocephalic and atraumatic.  Mouth/Throat: Uvula is midline. No trismus in the jaw. No oropharyngeal exudate or posterior oropharyngeal edema.  Uvula midline. No tonsillar enlargement or exudate. No trismus.   Eyes: Conjunctivae and EOM are normal.  Neck: Neck supple. No tracheal deviation present.  Cardiovascular: Normal rate, regular rhythm and normal heart sounds.   Pulmonary/Chest: Effort normal and breath sounds normal. No respiratory distress. She has no wheezes. She has no rales.  Musculoskeletal: Normal range of motion.  Neurological: She is alert and oriented to person, place, and time.  Skin: Skin is warm and dry.  Along the upper chest  there is a fine, miliary-like skin irritation noted along the mid clavicular region near the sternum. No petechiae. No vesicular lesions.   Psychiatric: She has a normal mood and affect. Her behavior is normal.    ED Course  Procedures (including critical care time)  DIAGNOSTIC STUDIES: Oxygen Saturation is 100% on RA, normal by my interpretation.    COORDINATION OF CARE: 9:53 AM-Discussed treatment plan which includes hydrocortisone  cream and PCP follow up with pt at bedside and pt agreed to plan. No red flags.  Doubt allergic reaction.    Labs Review Labs Reviewed - No data to display  Imaging Review No results found.   EKG Interpretation None      MDM   Final diagnoses:  Rash of neck    BP 110/52  Pulse 82  Resp 16  SpO2 100%   I personally performed the services described in this documentation, which was scribed in my presence. The recorded information has been reviewed and is accurate.  Fayrene HelperBowie Tran, PA-C 12/12/13 [phone]

## 2013-12-14 ENCOUNTER — Ambulatory Visit: Payer: No Typology Code available for payment source | Attending: Internal Medicine

## 2014-01-02 ENCOUNTER — Ambulatory Visit: Payer: Self-pay

## 2014-01-04 ENCOUNTER — Ambulatory Visit: Payer: Self-pay | Admitting: Internal Medicine

## 2014-02-03 ENCOUNTER — Encounter: Payer: Self-pay | Admitting: Internal Medicine

## 2014-02-03 ENCOUNTER — Ambulatory Visit: Payer: Self-pay | Attending: Internal Medicine | Admitting: Internal Medicine

## 2014-02-03 VITALS — BP 124/67 | HR 74 | Temp 98.4°F | Resp 16 | Ht 64.0 in | Wt 149.0 lb

## 2014-02-03 DIAGNOSIS — Z1272 Encounter for screening for malignant neoplasm of vagina: Secondary | ICD-10-CM | POA: Insufficient documentation

## 2014-02-03 DIAGNOSIS — Z01419 Encounter for gynecological examination (general) (routine) without abnormal findings: Secondary | ICD-10-CM | POA: Insufficient documentation

## 2014-02-03 DIAGNOSIS — Z1151 Encounter for screening for human papillomavirus (HPV): Secondary | ICD-10-CM | POA: Insufficient documentation

## 2014-02-03 DIAGNOSIS — Z124 Encounter for screening for malignant neoplasm of cervix: Secondary | ICD-10-CM

## 2014-02-03 DIAGNOSIS — Z0000 Encounter for general adult medical examination without abnormal findings: Secondary | ICD-10-CM

## 2014-02-03 DIAGNOSIS — Z23 Encounter for immunization: Secondary | ICD-10-CM

## 2014-02-03 LAB — POCT URINALYSIS DIPSTICK
Bilirubin, UA: NEGATIVE
Glucose, UA: NEGATIVE
KETONES UA: NEGATIVE
Leukocytes, UA: NEGATIVE
Nitrite, UA: NEGATIVE
PH UA: 6
PROTEIN UA: NEGATIVE
Spec Grav, UA: 1.025
Urobilinogen, UA: 0.2

## 2014-02-03 NOTE — Progress Notes (Signed)
Pt here for a annual physical and pap smear. Last one on jan 2014

## 2014-02-03 NOTE — Patient Instructions (Signed)
Clarinda (Health Maintenance) Adoptar un estilo de vida saludable y recibir atencin preventiva pueden ser de suma utilidad para promover la salud y Musician. Hable con el mdico para saber cul es el esquema de exmenes peridicos adecuado para usted. Esta es una buena oportunidad para Teacher, adult education peridicamente al mdico sobre cmo prevenir enfermedades y [place] sano. Entre cada control mdico, hay muchas cosas que puede hacer por s solo. Los expertos han investigado mucho acerca de los cambios en el estilo de vida y las medidas preventivas que muy probablemente preserven su salud. Consulte al mdico para obtener ms informacin. EL PESO Y LA DIETA  Consuma una dieta saludable.  Incluya abundante cantidad de verduras, frutas, productos lcteos descremados y protenas magras.  No coma muchos alimentos con alto contenido de grasas slidas, azcares agregados o sal.  Realice actividad fsica con regularidad. Esta es una de las cosas ms importantes que puede hacer por su salud.  La [employer] de las personas adultas deben hacer actividad fsica durante por lo menos 140mnutos semanales. El ejercicio debe aumentar la frecuencia cardaca y hNature conservation officersudar (ejercicio de intensidad moderada).  Adems, casi todos los adultos deben hacer ejercicios de fortalecimiento al mToysRusveces por semana como complemento del ejercicio de iDante Mantenga un peso saludable.  El ndice de masa corporal ([facility] es una medida que puede usarse para identificar posibles problemas relacionados con el peso. Este ofrece un clculo estimativo de la gAir traffic controlleren funcin del peso y lAgricultural consultant El mdico puede determinar su IEncino Outpatient Surgery Center LLCy ayudarlo a aScience writery mTheatre managerun peso saludable.  Para las mujeres mayores de 20aos:  Un ISurgicare Surgical Associates Of Mahwah LLCmenor de 18,5 se considera bajo peso.  Un IBaptist Health Medical Center - North Little Rockentre 18,5 y 24,9 es normal.  Un ITerrell State Hospitalentre 25 y 29,9 es sobrepeso.  Un IMC de 30 o ms se considera  obesidad. Controlar los niveles de colesterol y lpidos en la sangre  Debe comenzar a hacerse anlisis de sangre para controlar los nAscutneyde lpidos y colesterol a partir de lSed[place] y repetir estos estudios cada 5aos.  Tal vez deba someterse a controles de los niveles de colesterol con ms frecuencia si:  Tiene los niveles de lpidos o colesterol elevados.  Es mayor de 50aos.  Tiene un riesgo alto de tener enfermedades cardacas. DETECCIN DE CNCER  Cncer de pulmn  Se recomienda realizar exmenes de deteccin de cncer de pulmn a las personas adultas que tienen entre 581y 80aos, y corren riesgo de tBest boycncer de pulmn debido a sus antecedentes de tabaquismo.  Se recomienda realizar una tomografa computarizada anual de baja dosis de los pulmones a las personas que:  Siguen fumando.  Hayan dejado de fumar en los ltimos 15aos.  Hayan fumado un paquete diario durante 30aos. El ndice ao-paquete equivale a fumar, en promedio, un paquete de cigarrillos diario durante 1ao.  Debe seguir realizndose estudios de deteccin anual hasta que hayan pasado 15aos desde que dej de fumar.  Estos estudios deben suspenderse si tiene un problema de salud que le impedira recibir tratamiento para eScience writerde pulmn. Cncer de mama  Ponga en prctica la "autoconciencia de las mamas". Esto significa reconocer la apariencia normal de las mamas y cSt. [place]  Adems implica realizarse autoexmenes peridicos de lJohnson & Johnson Informe al mdico si hay algn cambio, sin importar cun pequeo sea.  Si tiene entre 20 y 30aos, un mdico debe hacerle un examen clnico de las mamas cada 1 a 316aoscomo parte del  examen habitual de salud.  Si es mayor de 40aos, debe Electrical engineer un examen clnico de las Microsoft. Tambin debe considerar la posibilidad de realizarse una radiografa de las mamas ([place]) todos los [place].  Si tiene antecedentes familiares de cncer de  mama, hable con el mdico para saber si debe someterse a un estudio gentico.  Si tiene un riesgo alto de Animal nutritionist de mama, hable con el mdico para saber si debe hacerse una resonancia magntica y [employer].  Se recomienda una evaluacin del gen del cncer de mama (BRCA) a las mujeres que tengan familiares con tumores malignos relacionados con el BRCA. Los tumores malignos relacionados con elBRCA incluyen:  [employer].  Los [employer].  Los tumores malignos del peritoneo.  Los resultados de la evaluacin determinarn la necesidad de asesoramiento gentico y de [place] de BRCA1 y BRCA2. Cncer de cuello uterino Ya no se recomiendan los exmenes plvicos de rutina para la deteccin del cncer de cuello uterino en las mujeres que no estn embarazadas que son consideradas sujetos de bajo riesgo de Best boy cncer de los rganos de la pelvis (ovarios, tero y vagina) y que no tienen sntomas. Tal vez sea necesario realizar un examen plvico si tiene sntomas, incluidos aquellos que estn asociados con infecciones en la pelvis. Pregntele al mdico si un examen plvico de deteccin es adecuado para usted.   El Papanicolau es la prueba de deteccin del cncer de cuello uterino para las mujeres que podran Engineer, production.  Si le han realizado una histerectoma por un problema que no era cncer u otra enfermedad que podra causar cncer, ya no necesitar realizarse pruebas de Papanicolaou.  Si es mayor de 39aos y los resultados de las pruebas de Papanicolaou han sido normales durante los ltimos 10aos, ya no es necesario que se realice estos estudios.  Si ha recibido un tratamiento para el cncer de cuello uterino o para una enfermedad que podra causar cncer, necesitar realizarse una prueba de Papanicolaou y controles durante al menos 26 aos de concluido el [place].  Si ya no se realiza pruebas de Papanicolaou, debe evaluar sus factores de riesgo si estos  se modifican (por ejemplo, tiene un nuevo compaero sexual). Esta situacin puede influir en la necesidad de que se someta nuevamente a estudios de deteccin.  Algunas mujeres sufren problemas mdicos que aumentan la probabilidad de tener cncer de cuello uterino. En este caso, el mdico podr indicarle que se someta a exmenes de deteccin y pruebas de Papanicolaou con ms frecuencia.  La prueba del virus del Engineer, technical sales (VPH) es un estudio adicional que puede usarse para la deteccin del cncer de cuello uterino. Esta prueba busca la presencia del virus que puede causar cambios celulares en el cuello del tero. Las clulas que se recolectan durante la prueba de Papanicolaou pueden usarse para el VPH.  La prueba del VPH puede usarse para examinar a las Cendant Corporation de 46TKP. Someterse a International aid/development worker del VPH puede prolongar el [employer] las pruebas de Papanicolaou normales de tres a Product manager.  Adems, se debe realizar la prueba del VPH para evaluar a las mujeres de cualquier edad cuyos resultados del Papanicolau no sean claros.  Despus de los 30aos, las mujeres deben realizarse pruebas del VPH con la misma frecuencia que las pruebas de Papanicolau. Cncer colorrectal  Es posible detectar este tipo de cncer y, a menudo, es posible prevenirlo.  Generalmente, los estudios de deteccin de  rutina del cncer colorrectal empiezan a hacerse a partir de los 50aos y continan hasta los 75aos.  El mdico puede recomendar que se los haga antes, si tiene factores de riesgo de cncer de colon.  Adems, el mdico puede recomendar que use un kit de prueba casera para hallar sangre oculta en la materia fecal.  Es posible que se use una pequea cmara en el extremo de un tubo para examinar directamente el colon (sigmoidoscopa o colonoscopa), con el fin de detectar las formas ms incipientes de cncer colorrectal.  Generalmente, los estudios de deteccin de rutina se realizan  a partir de los 50aos.  El examen directo del colon debe repetirse cada 5 a 10aos hasta cumplir 75aos. Sin embargo, tal vez deba someterse a la prueba de deteccin con ms frecuencia si se encuentran formas incipientes de plipos precancerosos o pequeos tumores. Cncer de piel  Revsese la piel peridicamente desde los dedos de los pies hasta la cabeza.  Informe al mdico si aparecen nuevos lunares o si nota cambios en los que ya tiene, especialmente en la forma o el color.  Tambin notifquele si tiene un lunar cuyo tamao es ms grande que el de la goma de un lpiz.  Use siempre pantalla solar. Aplique pantalla solar tantas veces como pueda a lo largo del da.  Protjase usando mangas y pantalones largos, un sombrero de ala ancha y gafas para el sol todo el ao, siempre que est al aire libre. ENFERMEDADES CARDACAS, DIABETES E HIPERTENSIN ARTERIAL   Debe controlarse la presin arterial al menos cada 1 o 2aos. La hipertensin arterial causa enfermedades cardacas y aumenta el riesgo de ictus.  Si tiene entre 55 y 79aos, consulte al mdico si debe tomar aspirina para prevenir ictus.  Hgase anlisis peridicos para la diabetes, que incluyen la toma de una muestra de sangre para controlar el nivel de azcar en la sangre mientras est en ayunas.  Si su peso es normal y tiene un riesgo bajo de tener diabetes, hgase este anlisis una vez cada tres aos, despus de los 45aos.  Si tiene sobrepeso y un riesgo alto de sufrir diabetes, considere la posibilidad de hacerse los anlisis a una edad ms temprana o con ms frecuencia. PREVENCIN DE INFECCIONES  Hepatitis B  Si tiene un riesgo ms alto de tener hepatitisB, debe hacerse anlisis de deteccin de este virus. Se considera que tiene un alto riesgo de hepatitis B si:  Naci en un pas donde la hepatitisB es frecuente. Pregntele al mdico qu pases son considerados de alto riesgo.  Sus padres nacieron en un pas de alto  riesgo, y usted no recibi la vacuna contra la hepatitis B.  Tiene VIH o sida.  Usa agujas para inyectarse drogas.  Convive con una persona que tiene hepatitisB.  Tuvo relaciones sexuales con una persona que tiene hepatitisB.  Recibe tratamiento de hemodilisis.  Toma ciertos medicamentos para cuadros clnicos tales como cncer, trasplante de  

## 2014-02-03 NOTE — Progress Notes (Signed)
Patient ID: Jamie Friedman, female   DOB: 1971-05-14, 43 y.o.   MRN: [id number]  CC: annual and pap  HPI:  Patient presents to clinic today for her annual physical and pap smear.  Patient has no c/o today.  She denies vaginal discharge, odor, lesions, itch, dysuria, or hematuria.  She desires STD testing today.    No Known Allergies No past medical history on file. Current Outpatient Prescriptions on File Prior to Visit  Medication Sig Dispense Refill  . amoxicillin-clavulanate (AUGMENTIN) 875-125 MG per tablet Take 1 tablet by mouth 2 (two) times daily.  20 tablet  0  . hydrocortisone cream 1 % Apply to affected area 2 times daily  15 g  0  . ibuprofen (ADVIL,MOTRIN) 600 MG tablet Take 1 tablet (600 mg total) by mouth every 6 (six) hours as needed.  30 tablet  1  . ibuprofen (ADVIL,MOTRIN) 600 MG tablet Take 1 tablet (600 mg total) by mouth every 8 (eight) hours as needed.  30 tablet  1  . Multiple Vitamin (MULTIVITAMIN WITH MINERALS) TABS tablet Take 1 tablet by mouth daily.       No current facility-administered medications on file prior to visit.   No family history on file. History   Social History  . Marital Status: Single    Spouse Name: N/A    Number of Children: N/A  . Years of Education: N/A   Occupational History  . Not on file.   Social History Main Topics  . Smoking status: Never Smoker   . Smokeless tobacco: Not on file  . Alcohol Use: No  . Drug Use: No  . Sexual Activity: Yes    Birth Control/ Protection: Condom   Other Topics Concern  . Not on file   Social History Narrative  . No narrative on file    Review of Systems: Constitutional: Negative for fever, chills, diaphoresis, activity change, appetite change and fatigue. HENT: Negative for ear pain, nosebleeds, congestion, facial swelling, rhinorrhea, neck pain, neck stiffness and ear discharge.  Eyes: Negative for pain, discharge, redness, itching and visual disturbance. Respiratory:  Negative for cough, choking, chest tightness, shortness of breath, wheezing and stridor.  Cardiovascular: Negative for chest pain, palpitations and leg swelling. Gastrointestinal: Negative for abdominal distention. Genitourinary: Negative for dysuria, urgency, frequency, hematuria, flank pain, decreased urine volume, difficulty urinating and dyspareunia.  Musculoskeletal: Negative for back pain, joint swelling, arthralgias and gait problem. Neurological: Negative for dizziness, tremors, seizures, syncope, facial asymmetry, speech difficulty, weakness, light-headedness, numbness and headaches.  Hematological: Negative for adenopathy. Does not bruise/bleed easily. Psychiatric/Behavioral: Negative for hallucinations, behavioral problems, confusion, dysphoric mood, decreased concentration and agitation.    Objective:   Filed Vitals:   02/03/14 1700  BP: 124/67  Pulse: 74  Temp: 98.4 F (36.9 C)  Resp: 16    Physical Exam: Constitutional: Patient appears well-developed and well-nourished. No distress. HENT: Normocephalic, atraumatic, External right and left ear normal. Oropharynx is clear and moist.  Eyes: Conjunctivae and EOM are normal. PERRLA, no scleral icterus. Neck: Normal ROM. Neck supple. No JVD. No tracheal deviation. No thyromegaly. CVS: RRR, S1/S2 +, no murmurs, no gallops, no carotid bruit.  Pulmonary: Effort and breath sounds normal, no stridor, rhonchi, wheezes, rales.  Abdominal: Soft. BS +,  no distension, tenderness, rebound or guarding.  Musculoskeletal: Normal range of motion. No edema and no tenderness.  Lymphadenopathy: No lymphadenopathy noted, cervical Neuro: Alert. Normal reflexes, muscle tone coordination. No cranial nerve deficit. Skin: Skin is warm and  dry. No rash noted. Not diaphoretic. No erythema. No pallor. Psychiatric: Normal mood and affect. Behavior, judgment, thought content normal.  Lab Results  Component Value Date   WBC 6.9 05/09/2013   HGB 14.9  05/09/2013   HCT 44.6 05/09/2013   MCV 100.0 05/09/2013   PLT 106* 05/09/2013   Lab Results  Component Value Date   CREATININE 0.84 05/09/2013   BUN 14 05/09/2013   NA 140 05/09/2013   K 4.2 05/09/2013   CL 102 05/09/2013   CO2 26 05/09/2013    No results found for this basename: HGBA1C   Lipid Panel     Component Value Date/Time   CHOL 163 04/27/2013 0948   TRIG 65 04/27/2013 0948   HDL 57 04/27/2013 0948   CHOLHDL 2.9 04/27/2013 0948   VLDL 13 04/27/2013 0948   LDLCALC 93 04/27/2013 0948       Assessment and plan:   Jamie Friedman was seen today for annual exam and gynecologic exam.  Diagnoses and associated orders for this visit:  Annual physical exam - CBC; Future - COMPLETE METABOLIC PANEL WITH GFR; Future - TSH; Future - Hemoglobin A1C; Future - Vitamin D, 25-hydroxy; Future - POCT urinalysis dipstick - Lipid panel; Future - MM Digital Screening; Future - Tdap vaccine greater than or equal to 7yo IM  Papanicolaou smear - Cytology - PAP Jamie Friedman - Cervicovaginal ancillary only   Return in about 1 week (around 02/10/2014) for Lab Visit, 1 year.      Jamie Friedman, Jamie Wogan, NP-C [facility] [phone] 02/03/2014, 5:14 PM

## 2014-02-06 ENCOUNTER — Ambulatory Visit: Payer: No Typology Code available for payment source | Attending: Internal Medicine

## 2014-02-06 DIAGNOSIS — Z0000 Encounter for general adult medical examination without abnormal findings: Secondary | ICD-10-CM

## 2014-02-06 LAB — CBC
HCT: 39.1 % (ref 36.0–46.0)
HEMOGLOBIN: 13.2 g/dL (ref 12.0–15.0)
MCH: 31.4 pg (ref 26.0–34.0)
MCHC: 33.8 g/dL (ref 30.0–36.0)
MCV: 93.1 fL (ref 78.0–100.0)
Platelets: 114 10*3/uL — ABNORMAL LOW (ref 150–400)
RBC: 4.2 MIL/uL (ref 3.87–5.11)
RDW: 14.2 % (ref 11.5–15.5)
WBC: 4.8 10*3/uL (ref 4.0–10.5)

## 2014-02-06 LAB — COMPLETE METABOLIC PANEL WITH GFR
ALBUMIN: 4.5 g/dL (ref 3.5–5.2)
ALT: 29 U/L (ref 0–35)
AST: 24 U/L (ref 0–37)
Alkaline Phosphatase: 76 U/L (ref 39–117)
BILIRUBIN TOTAL: 0.9 mg/dL (ref 0.2–1.2)
BUN: 14 mg/dL (ref 6–23)
CO2: 28 mEq/L (ref 19–32)
Calcium: 9.5 mg/dL (ref 8.4–10.5)
Chloride: 105 mEq/L (ref 96–112)
Creat: 0.78 mg/dL (ref 0.50–1.10)
GFR, Est African American: >89 mL/min
GLUCOSE: 89 mg/dL (ref 70–99)
POTASSIUM: 4.4 meq/L (ref 3.5–5.3)
Sodium: 139 mEq/L (ref 135–145)
Total Protein: 6.8 g/dL (ref 6.0–8.3)

## 2014-02-06 LAB — LIPID PANEL
CHOL/HDL RATIO: 3.5 ratio
Cholesterol: 205 mg/dL — ABNORMAL HIGH (ref 0–200)
HDL: 59 mg/dL (ref >39)
LDL Cholesterol: 117 mg/dL — ABNORMAL HIGH (ref 0–99)
Triglycerides: 145 mg/dL (ref <150)
VLDL: 29 mg/dL (ref 0–40)

## 2014-02-06 LAB — HEMOGLOBIN A1C
Hgb A1c MFr Bld: 5.6 % (ref <5.7)
Mean Plasma Glucose: 114 mg/dL (ref <117)

## 2014-02-07 LAB — TSH: TSH: 5.643 u[IU]/mL — AB (ref 0.350–4.500)

## 2014-02-07 LAB — VITAMIN D 25 HYDROXY (VIT D DEFICIENCY, FRACTURES): Vit D, 25-Hydroxy: 21 ng/mL — ABNORMAL LOW (ref 30–89)

## 2014-02-08 LAB — CYTOLOGY - PAP

## 2014-02-14 ENCOUNTER — Telehealth: Payer: Self-pay | Admitting: *Deleted

## 2014-02-14 NOTE — Telephone Encounter (Signed)
Left voice message to return call      Pap negative for malignancies and infection

## 2014-02-15 ENCOUNTER — Other Ambulatory Visit: Payer: Self-pay | Admitting: *Deleted

## 2014-02-15 DIAGNOSIS — E038 Other specified hypothyroidism: Secondary | ICD-10-CM

## 2014-02-15 NOTE — Progress Notes (Signed)
Pt in our office for lab results and lab work T4 Free, Per [employer]

## 2014-02-20 ENCOUNTER — Ambulatory Visit (HOSPITAL_COMMUNITY)
Admission: RE | Admit: 2014-02-20 | Discharge: 2014-02-20 | Disposition: A | Discharge status: Home / Self Care | Payer: No Typology Code available for payment source | Source: Ambulatory Visit | Attending: Internal Medicine | Admitting: Internal Medicine

## 2014-02-20 ENCOUNTER — Other Ambulatory Visit: Payer: Self-pay | Admitting: Internal Medicine

## 2014-02-20 DIAGNOSIS — Z1231 Encounter for screening mammogram for malignant neoplasm of breast: Secondary | ICD-10-CM

## 2014-02-20 DIAGNOSIS — Z0000 Encounter for general adult medical examination without abnormal findings: Secondary | ICD-10-CM

## 2014-02-28 ENCOUNTER — Ambulatory Visit: Payer: Self-pay

## 2014-03-01 ENCOUNTER — Ambulatory Visit: Payer: No Typology Code available for payment source | Attending: Internal Medicine

## 2014-03-25 IMAGING — CT CT HEAD W/O CM
2 series · 16 of 30 positions shown, 20 images · non-contrast
Comparison: None.

CLINICAL DATA: Three-day history of frontal headache

EXAM:
CT HEAD WITHOUT CONTRAST
TECHNIQUE: Contiguous axial images were obtained from the base of the skull
through the vertex without intravenous contrast. Study was obtained
within 24 hr of patient's arrival at the emergency department.

[Series 2: head w/o · axial · non-contrast · 0.49mm/px · z∈[+111,+241]mm · 13 of 32 slices shown, 17 images]
[im 3/32  brain]
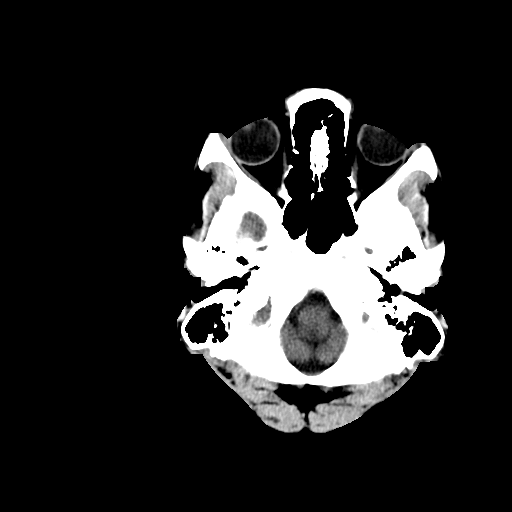
[im 3/32  bone]
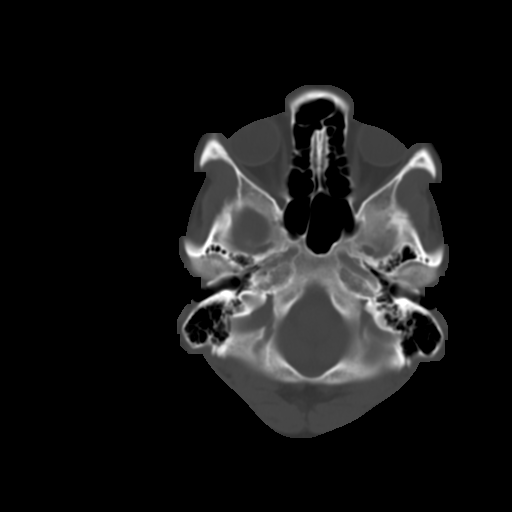
[im 5/32  brain]
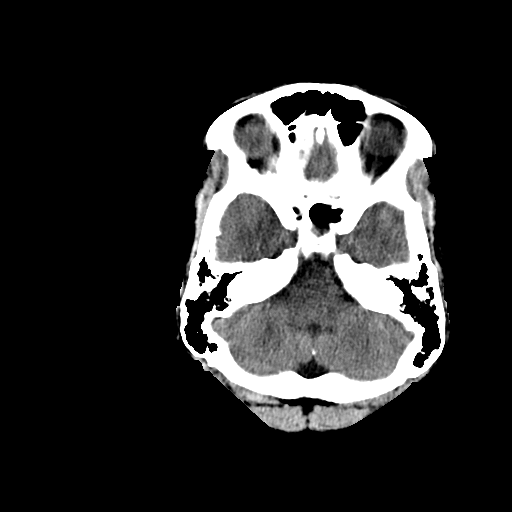
[im 7/32  brain]
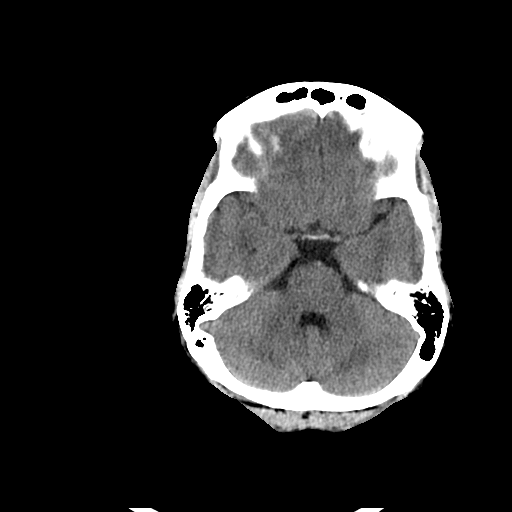
[im 9/32  brain]
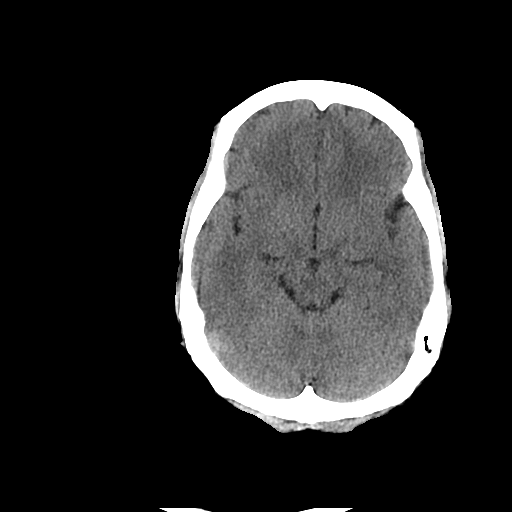
[im 12/32  brain]
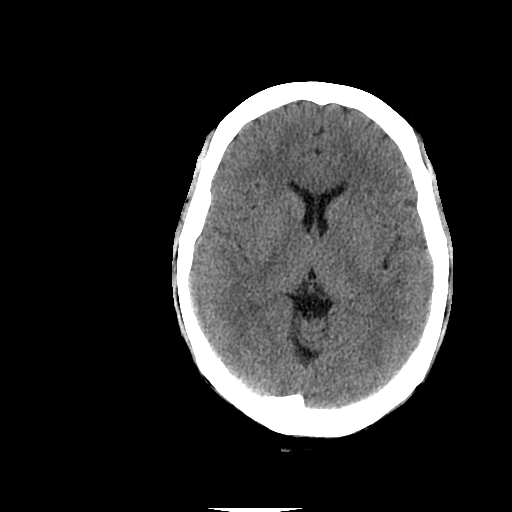
[im 12/32  bone]
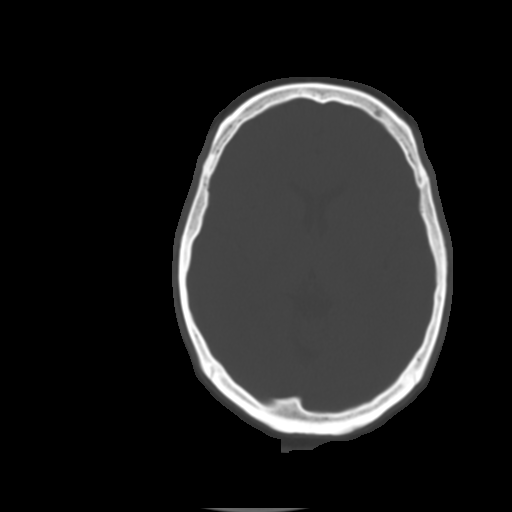
[im 14/32  brain]
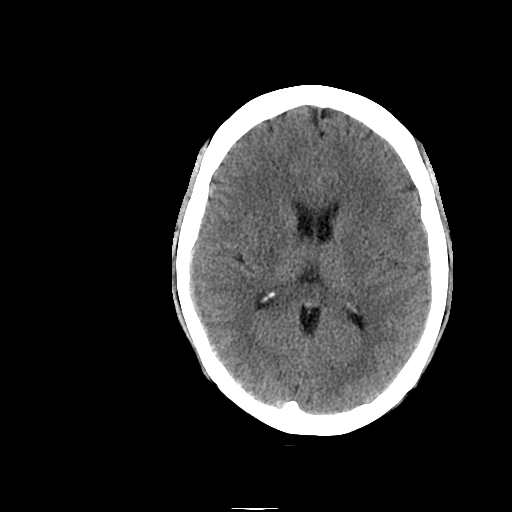
[im 16/32  brain]
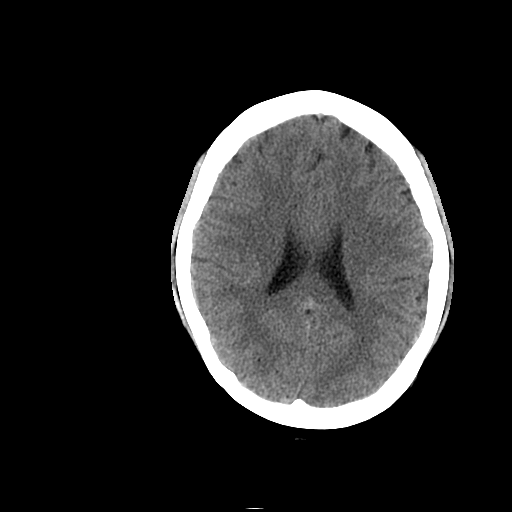
[im 18/32  brain]
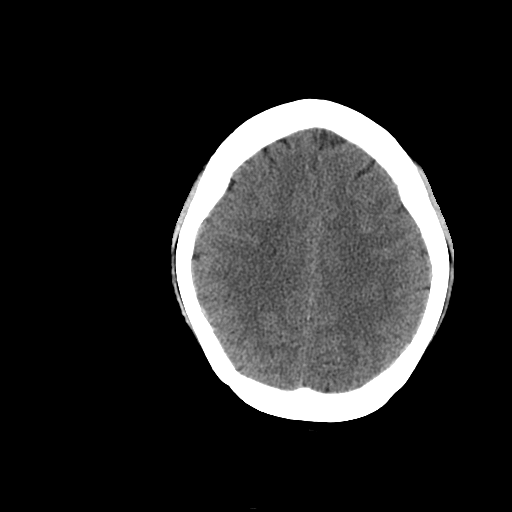
[im 20/32  brain]
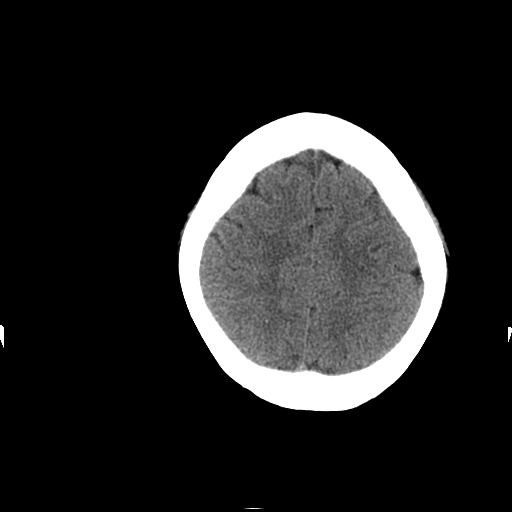
[im 20/32  bone]
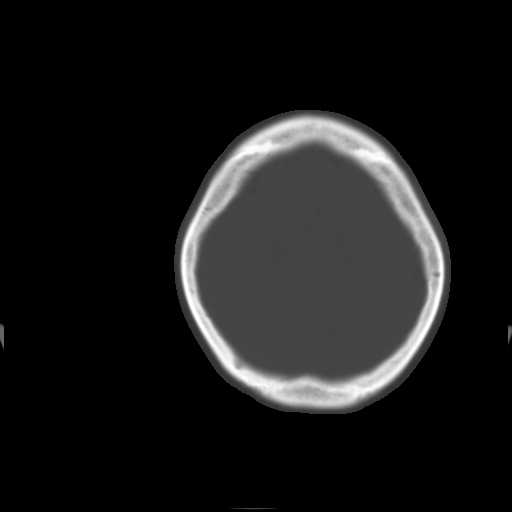
[im 23/32  brain]
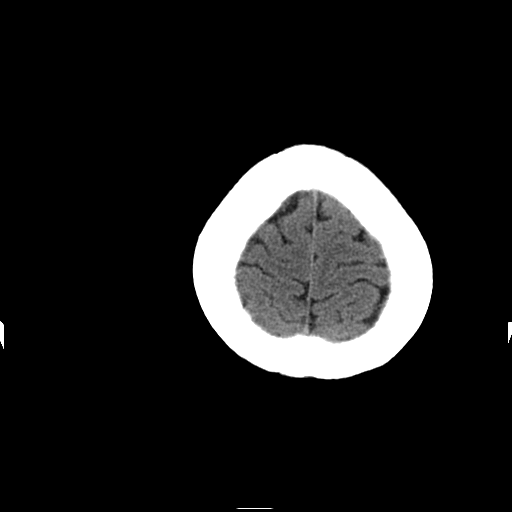
[im 25/32  brain]
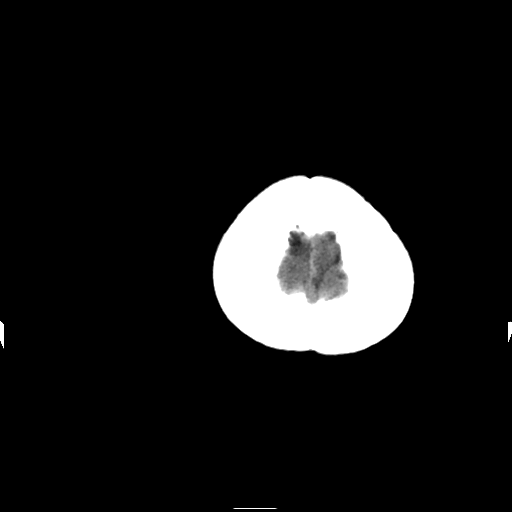
[im 27/32  brain]
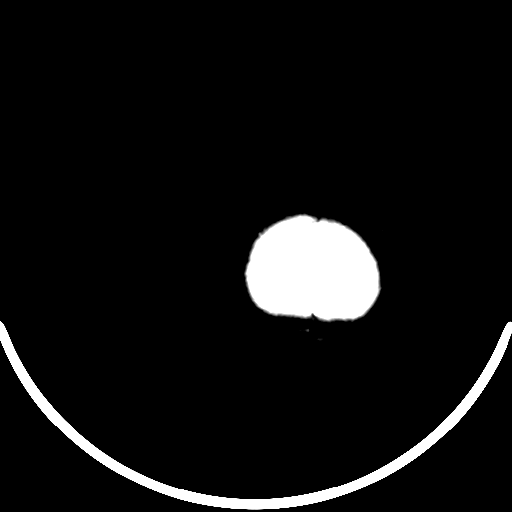
[im 29/32  brain]
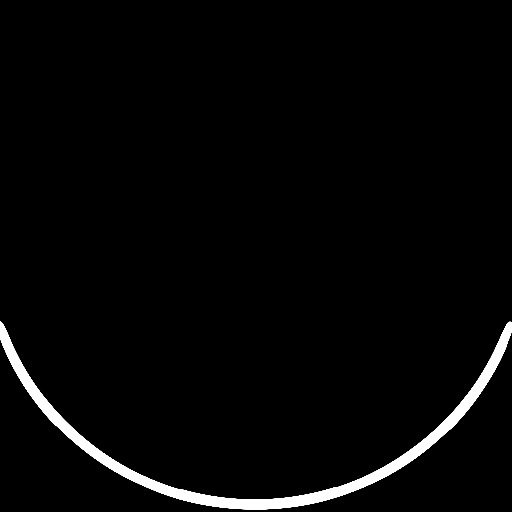
[im 29/32  bone]
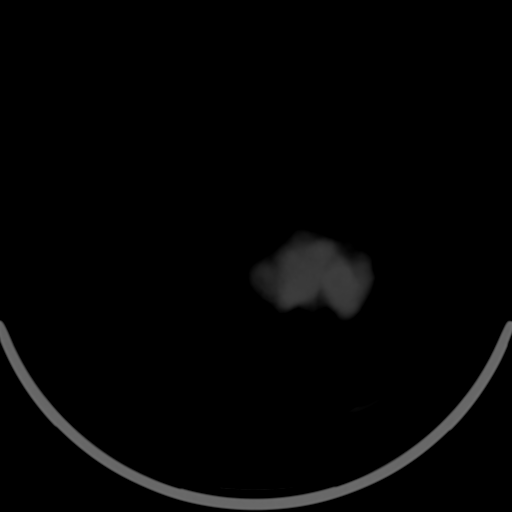

[Series 3: head w/o bone · axial · non-contrast · 0.49mm/px · z∈[+111,+156]mm · 3 of 32 slices shown]
[im 3/32  bone]
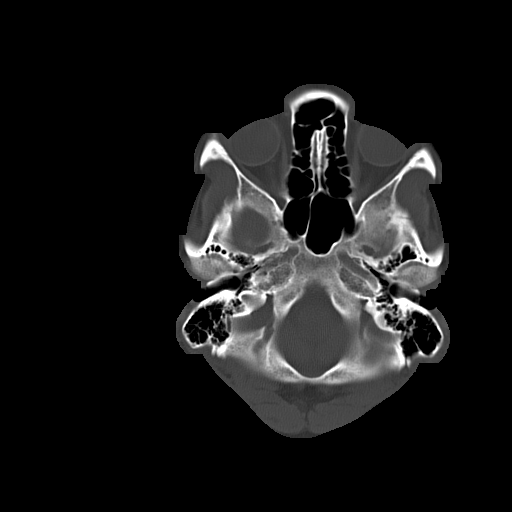
[im 7/32  bone]
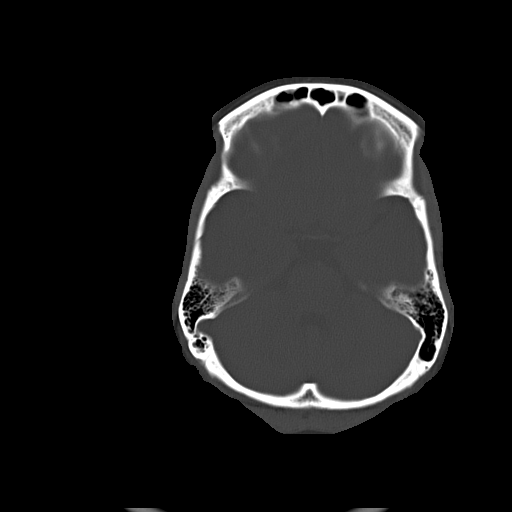
[im 12/32  bone]
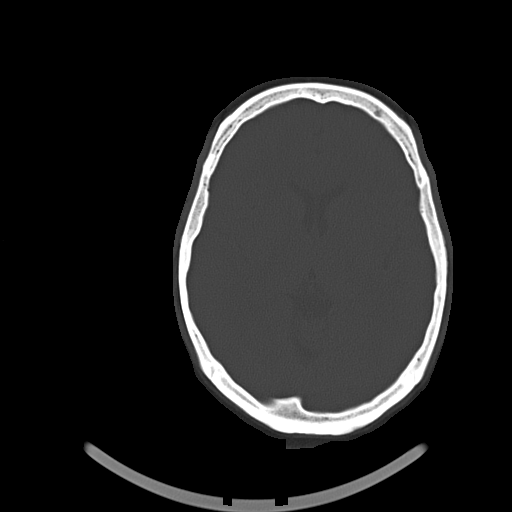

[16 of 30 positions shown; findings below may reference images not displayed]

FINDINGS: The ventricles are normal in size and configuration. There is no
mass, hemorrhage, extra-axial fluid collection, or midline shift.
There is a punctate calcification in the medial right frontal lobe
which probably represents a small granuloma. The gray-white
compartments are otherwise normal. There is no demonstrable acute
infarct.

Bony calvarium appears intact.  Mastoid air cells are clear.
IMPRESSION: Probable small calcified granuloma in the right medial frontal lobe.
No hemorrhage or mass. Elsewhere gray-white compartments are normal.

## 2014-04-05 ENCOUNTER — Other Ambulatory Visit (HOSPITAL_COMMUNITY)
Admission: RE | Admit: 2014-04-05 | Discharge: 2014-04-05 | Disposition: A | Discharge status: Home / Self Care | Payer: No Typology Code available for payment source | Source: Ambulatory Visit | Attending: Internal Medicine | Admitting: Internal Medicine

## 2014-04-05 ENCOUNTER — Ambulatory Visit: Payer: No Typology Code available for payment source | Attending: Internal Medicine | Admitting: Internal Medicine

## 2014-04-05 ENCOUNTER — Encounter: Payer: Self-pay | Admitting: Internal Medicine

## 2014-04-05 VITALS — BP 99/64 | HR 68 | Temp 97.9°F | Resp 16 | Ht 65.0 in | Wt 152.0 lb

## 2014-04-05 DIAGNOSIS — N760 Acute vaginitis: Secondary | ICD-10-CM | POA: Insufficient documentation

## 2014-04-05 DIAGNOSIS — N898 Other specified noninflammatory disorders of vagina: Secondary | ICD-10-CM

## 2014-04-05 LAB — CERVICOVAGINAL ANCILLARY ONLY
WET PREP (BD AFFIRM): NEGATIVE
WET PREP (BD AFFIRM): POSITIVE — AB
Wet Prep (BD Affirm): NEGATIVE

## 2014-04-05 MED ORDER — NYSTATIN 100000 UNIT/GM EX CREA: 1.0000 "application " | TOPICAL_CREAM | Freq: Two times a day (BID) | CUTANEOUS | Status: DC

## 2014-04-05 MED ORDER — FLUCONAZOLE 150 MG PO TABS: 150.0000 mg | ORAL_TABLET | Freq: Once | ORAL | Status: DC

## 2014-04-05 NOTE — Progress Notes (Signed)
Patient ID: Jamie Friedman, female   DOB: [birth year], 43 y.o.   MRN: [id number]  CC: vaginal irritation  HPI:  Patient c/o vaginal irritation with itching.  Has been present for past eight days.  Denies vaginal discharge, odor, or lesions. She has noticed some bumps but they go away after using a "cream".  No dyspareunia or new partners.  LMP 03/12/14.    No Known Allergies History reviewed. No pertinent past medical history. Current Outpatient Prescriptions on File Prior to Visit  Medication Sig Dispense Refill  . amoxicillin-clavulanate (AUGMENTIN) 875-125 MG per tablet Take 1 tablet by mouth 2 (two) times daily.  20 tablet  0  . hydrocortisone cream 1 % Apply to affected area 2 times daily  15 g  0  . ibuprofen (ADVIL,MOTRIN) 600 MG tablet Take 1 tablet (600 mg total) by mouth every 6 (six) hours as needed.  30 tablet  1  . ibuprofen (ADVIL,MOTRIN) 600 MG tablet Take 1 tablet (600 mg total) by mouth every 8 (eight) hours as needed.  30 tablet  1  . Multiple Vitamin (MULTIVITAMIN WITH MINERALS) TABS tablet Take 1 tablet by mouth daily.       No current facility-administered medications on file prior to visit.   History reviewed. No pertinent family history. History   Social History  . Marital Status: Single    Spouse Name: N/A    Number of Children: N/A  . Years of Education: N/A   Occupational History  . Not on file.   Social History Main Topics  . Smoking status: Never Smoker   . Smokeless tobacco: Not on file  . Alcohol Use: No  . Drug Use: No  . Sexual Activity: Yes    Birth Control/ Protection: Condom   Other Topics Concern  . Not on file   Social History Narrative  . No narrative on file    Review of Systems: See HPI    Objective:   Filed Vitals:   04/05/14 1202  BP: 99/64  Pulse: 68  Temp: 97.9 F (36.6 C)  Resp: 16    Physical Exam  Cardiovascular: Normal rate, regular rhythm and normal heart sounds.   Pulmonary/Chest: Effort normal and  breath sounds normal.  Abdominal: Soft. She exhibits no distension. There is no tenderness.  Genitourinary: Uterus normal. Vaginal discharge (thick white) found.     Lab Results  Component Value Date   WBC 4.8 02/06/2014   HGB 13.2 02/06/2014   HCT 39.1 02/06/2014   MCV 93.1 02/06/2014   PLT 114* 02/06/2014   Lab Results  Component Value Date   CREATININE 0.78 02/06/2014   BUN 14 02/06/2014   NA 139 02/06/2014   K 4.4 02/06/2014   CL 105 02/06/2014   CO2 28 02/06/2014    Lab Results  Component Value Date   HGBA1C 5.6 02/06/2014   Lipid Panel     Component Value Date/Time   CHOL 205* 02/06/2014 0933   TRIG 145 02/06/2014 0933   HDL 59 02/06/2014 0933   CHOLHDL 3.5 02/06/2014 0933   VLDL 29 02/06/2014 0933   LDLCALC 117* 02/06/2014 0933       Assessment and plan:   Jamie Friedman was seen today for follow-up.  Diagnoses and associated orders for this visit:  Vaginal irritation - Cervicovaginal ancillary only - nystatin cream (MYCOSTATIN); Apply 1 application topically 2 (two) times daily. - fluconazole (DIFLUCAN) 150 MG tablet; Take 1 tablet (150 mg total) by mouth once.   Return if  symptoms worsen or fail to improve.        Holland CommonsKECK, Leticia Coletta, NP-C [facility] [phone] 04/09/2014, 5:33 PM

## 2014-04-05 NOTE — Progress Notes (Signed)
Pt is here c/o vaginal discomfort and itching.

## 2014-04-05 NOTE — Patient Instructions (Signed)
Vaginosis bacteriana (Bacterial Vaginosis) La vaginosis bacteriana es una infeccin de la vagina. Se produce cuando una cantidad excesiva de ciertos grmenes (bacterias) crece en la vagina. CUIDADOS EN EL HOGAR  Tome los medicamentos tal como se lo indic su mdico.  Finalice la prescripcin completa, aunque comience a sentirse mejor.  No mantenga relaciones sexuales hasta que finalice sus medicamentos y se Therapist, occupationalsienta mejor.  Comunique a sus compaeros sexuales que sufre una infeccin. Deben consultar a su mdico para iniciar un tratamiento.  Practique el sexo seguro. Use preservativos. Tenga solo un compaero sexual. SOLICITE AYUDA SI:  No mejora luego de 3 das de Woodburntratamiento.  Observa una secrecin (prdida) de color gris ms abundante que proviene de la vagina.  Siente ms dolor que antes.  Tiene fiebre. ASEGRESE DE QUE:   Comprende estas instrucciones.  Controlar su afeccin.  Recibir ayuda de inmediato si no mejora o si empeora. Document Released: 09/05/2008 Document Revised: 03/30/2013 [facility] CenterExit[facility] Patient Information 2015 [place]ExitCare, MarylandLLC. This information is not intended to replace advice given to you by your health care provider. Make sure you discuss any questions you have with your health care provider. Vulvovaginitis Candidisica (Candidal Vulvovaginitis) La vulvovaginitis candidisica es una infeccin de la vagina y la vulva. La vulva es la piel que rodea la abertura de la vagina. Puede causar picazn y [place] Islandsmolestias dentro de y alrededor de la vagina.  CUIDADOS EN EL HOGAR  Slo tome medicamentos como lo indique su mdico.  No mantenga relaciones sexuales hasta que la infeccin haya curado o segn le indique el mdico.  Practique sexo seguro.  Informe a su compaero sexual acerca de su infeccin.  No tome duchas vaginales ni use tampones.  Use ropa interior de algodn. No utilice pantalones ni pantimedias ajustados.  Coma yogur. Esto puede ayudar a tratar y  prevenir las infecciones por cndida. SOLICITE AYUDA DE INMEDIATO SI:   Tiene fiebre.  Los problemas empeoran durante el tratamiento, o si no mejora luego de [address] das.  Tiene malestar, irritacin, o picazn en la zona de la vagina o la vulva.  Siente dolor en al [employer] Comantener relaciones sexuales.  Comienza a sentir dolor abdominal. ASEGRESE DE QUE:  Comprende estas instrucciones.  Controlar su enfermedad.  Solicitar ayuda de inmediato si no mejora o empeora. Document Released: 07/12/2010 Document Revised: 06/14/2013 [facility] SugerlandExitCare Patient Information 2015 [place]ExitCare, MarylandLLC. This information is not intended to replace advice given to you by your health care provider. Make sure you discuss any questions you have with your health care provider.

## 2014-04-10 ENCOUNTER — Telehealth: Payer: Self-pay | Admitting: *Deleted

## 2014-04-10 NOTE — Telephone Encounter (Signed)
Pt doing good, used cram as directed

## 2014-04-10 NOTE — Telephone Encounter (Signed)
Message copied by Dyann KiefGIRALDEZ, Brecklynn Jian M on Mon Apr 10, 2014  3:19 PM ------      Message from: Holland CommonsKECK, VALERIE A      Created: Sun Apr 09, 2014  5:35 PM       Let patient know she did come back positive for a yeast infection.  Please explain disease.  I have already treated her with diflucan and nystatin cream.  Find out if she is having improvement in symptoms since medication use.  Thanks ------

## 2014-04-24 ENCOUNTER — Encounter: Payer: Self-pay | Admitting: Internal Medicine

## 2014-05-29 ENCOUNTER — Ambulatory Visit: Payer: No Typology Code available for payment source | Attending: Internal Medicine

## 2014-06-01 ENCOUNTER — Encounter: Payer: Self-pay | Admitting: Internal Medicine

## 2014-06-01 ENCOUNTER — Ambulatory Visit: Payer: No Typology Code available for payment source | Attending: Internal Medicine | Admitting: Internal Medicine

## 2014-06-01 ENCOUNTER — Other Ambulatory Visit: Payer: Self-pay

## 2014-06-01 ENCOUNTER — Ambulatory Visit (HOSPITAL_COMMUNITY)
Admission: RE | Admit: 2014-06-01 | Discharge: 2014-06-01 | Disposition: A | Discharge status: Home / Self Care | Payer: No Typology Code available for payment source | Source: Ambulatory Visit | Attending: Internal Medicine | Admitting: Internal Medicine

## 2014-06-01 VITALS — BP 105/65 | HR 69 | Temp 97.7°F | Resp 16 | Ht 65.0 in | Wt 155.0 lb

## 2014-06-01 DIAGNOSIS — Z79899 Other long term (current) drug therapy: Secondary | ICD-10-CM | POA: Insufficient documentation

## 2014-06-01 DIAGNOSIS — Z23 Encounter for immunization: Secondary | ICD-10-CM | POA: Insufficient documentation

## 2014-06-01 DIAGNOSIS — R0602 Shortness of breath: Secondary | ICD-10-CM | POA: Insufficient documentation

## 2014-06-01 DIAGNOSIS — R079 Chest pain, unspecified: Secondary | ICD-10-CM | POA: Insufficient documentation

## 2014-06-01 DIAGNOSIS — F419 Anxiety disorder, unspecified: Secondary | ICD-10-CM | POA: Insufficient documentation

## 2014-06-01 DIAGNOSIS — R51 Headache: Secondary | ICD-10-CM | POA: Insufficient documentation

## 2014-06-01 DIAGNOSIS — M549 Dorsalgia, unspecified: Secondary | ICD-10-CM | POA: Insufficient documentation

## 2014-06-01 MED ORDER — ALPRAZOLAM 0.5 MG PO TABS: 0.5000 mg | ORAL_TABLET | Freq: Two times a day (BID) | ORAL | Status: AC | PRN

## 2014-06-01 NOTE — Progress Notes (Signed)
Patient ID: Jamie Friedman, female   DOB: 07-17-1970, 43 y.o.   MRN: [id number]  CC: chest/back pain  HPI:  Patient presents to clinic today for evaluation of chest and back pain for the past three weeks.  The pain is in the middle of her chest and neck.  The pain is described as pressure in her chest.  The pain reportedly radiated to the left side of her jaw.  Feels SOB with walking or exercise, which then turns into chest pressure. Feels like she has phlegm in her throat.   Strange pain on left side of her head. Feels like a stabbing pain that becomes severe at times that has been presents for the past two weeks. Happens 2-3 times per day that causes her to sweat.  She reportedly takes tylenol for the pain.  Patient has suffered increased stress over the past couple of weeks due to her job closing down and her family in [place].     No Known Allergies History reviewed. No pertinent past medical history. Current Outpatient Prescriptions on File Prior to Visit  Medication Sig Dispense Refill  . amoxicillin-clavulanate (AUGMENTIN) 875-125 MG per tablet Take 1 tablet by mouth 2 (two) times daily. (Patient not taking: Reported on 06/01/2014) 20 tablet 0  . fluconazole (DIFLUCAN) 150 MG tablet Take 1 tablet (150 mg total) by mouth once. (Patient not taking: Reported on 06/01/2014) 1 tablet 0  . hydrocortisone cream 1 % Apply to affected area 2 times daily 15 g 0  . ibuprofen (ADVIL,MOTRIN) 600 MG tablet Take 1 tablet (600 mg total) by mouth every 6 (six) hours as needed. (Patient not taking: Reported on 06/01/2014) 30 tablet 1  . ibuprofen (ADVIL,MOTRIN) 600 MG tablet Take 1 tablet (600 mg total) by mouth every 8 (eight) hours as needed. 30 tablet 1  . Multiple Vitamin (MULTIVITAMIN WITH MINERALS) TABS tablet Take 1 tablet by mouth daily.    Marland Kitchen. nystatin cream (MYCOSTATIN) Apply 1 application topically 2 (two) times daily. (Patient not taking: Reported on 06/01/2014) 30 g 0   No current  facility-administered medications on file prior to visit.   History reviewed. No pertinent family history. History   Social History  . Marital Status: Single    Spouse Name: N/A    Number of Children: N/A  . Years of Education: N/A   Occupational History  . Not on file.   Social History Main Topics  . Smoking status: Never Smoker   . Smokeless tobacco: Not on file  . Alcohol Use: No  . Drug Use: No  . Sexual Activity: Yes    Birth Control/ Protection: Condom   Other Topics Concern  . Not on file   Social History Narrative    Review of Systems  Constitutional: Positive for diaphoresis.  Cardiovascular: Positive for chest pain.  Neurological: Positive for headaches. Negative for dizziness.  Psychiatric/Behavioral: The patient is nervous/anxious.   All other systems reviewed and are negative.   Objective:   Filed Vitals:   06/01/14 1046  BP: 105/65  Pulse: 69  Temp: 97.7 F (36.5 C)  Resp: 16    Physical Exam  Constitutional: She is oriented to person, place, and time.  Cardiovascular: Normal rate, regular rhythm and normal heart sounds.   Pulmonary/Chest: Effort normal and breath sounds normal.  Abdominal: Soft. Bowel sounds are normal.  Musculoskeletal: She exhibits no tenderness.  Neurological: She is alert and oriented to person, place, and time. No cranial nerve deficit.  Skin: Skin is  warm and dry.     Lab Results  Component Value Date   WBC 4.8 02/06/2014   HGB 13.2 02/06/2014   HCT 39.1 02/06/2014   MCV 93.1 02/06/2014   PLT 114* 02/06/2014   Lab Results  Component Value Date   CREATININE 0.78 02/06/2014   BUN 14 02/06/2014   NA 139 02/06/2014   K 4.4 02/06/2014   CL 105 02/06/2014   CO2 28 02/06/2014    Lab Results  Component Value Date   HGBA1C 5.6 02/06/2014   Lipid Panel     Component Value Date/Time   CHOL 205* 02/06/2014 0933   TRIG 145 02/06/2014 0933   HDL 59 02/06/2014 0933   CHOLHDL 3.5 02/06/2014 0933   VLDL 29  02/06/2014 0933   LDLCALC 117* 02/06/2014 0933       Assessment and plan:   Byrd HesselbachMaria was seen today for follow-up.  Diagnoses and associated orders for this visit:  Chest pain, unspecified chest pain type - EKG 12-Lead--NSR Likely due to anxiety, but explained signs and symptoms that should warrant immediate attention.  Patient verbalized understanding with teach back used.  Anxiety - ALPRAZolam (XANAX) 0.5 MG tablet; Take 1 tablet (0.5 mg total) by mouth 2 (two) times daily as needed for anxiety.  Need for influenza vaccination Influenza injection received.  Explained side effects and contraindications to patient. Information sheet given to patient.   Due to language barrier, an interpreter was present during the history-taking and subsequent discussion (and for part of the physical exam) with this patient.  Return in about 2 months (around 08/02/2014) for anxiety.        Holland CommonsKECK, Valon Glasscock, NP-C [facility] [phone] 06/01/2014, 11:04 AM

## 2014-06-01 NOTE — Progress Notes (Signed)
Pt is here stating that she has had sharp pain in her chest and back for about 3 weeks. Pt reports having a strange pain in her left side of her head. Pt also is coughing up white/ clear phlegm.

## 2014-06-01 NOTE — Patient Instructions (Signed)
El estrs (Stress) Los problemas mdicos relacionados con el estrs son cada vez ms frecuentes. El organismo tiene una respuesta fsica intrnseca para las situaciones estresantes. Cuando nos enfrentamos con situaciones complicadas, de presin o de peligro, tenemos que reaccionar rpidamente. Para ayudarnos a lograrlo, el organismo libera hormonas, como el cortisol y la [place]. Estas hormonas son parte de la respuesta "de lucha o escape" y afectan al metabolismo, la frecuencia cardaca y la presin arterial, y como resultado aumenta el estado de tensin que prepara al organismo para un desempeo ptimo al lidiar con una situacin estresante. Es probable que, para el hombre primitivo, estos mecanismos fueran una necesidad para mantenerse con vida. Pero generalmente, el estrs que [employer] en la actualidad no se origina por esta necesidad de subsistencia, y la liberacin de esas mismas hormonas puede daar la salud y Systems developer la capacidad de enfrentamiento. CAUSAS  Presin para lograr un buen desempeo laboral, escolar o deportivo.  Amenazas de violencia fsica.  Problemas de dinero.  Discusiones.  Conflictos familiares.  Divorcio o separacin de Civil engineer, contracting.  Duelo.  Empleo nuevo o desempleo.  Mudanzas.  Consumo excesivo de alcohol o drogas. A VECES, NO HAY UN MOTIVO ESPECFICO PARA DESARROLLAR ESTRS. Casi todas las personas corren riesgo de Restaurant manager, fast food en algn momento de sus vidas. Es importante saber que hay estrs temporario y estrs a Barrister's clerk.  El estrs temporario desaparecer cuando la situacin que lo genera se resuelva. La [employer] de las personas pueden sobrellevar los perodos cortos de Psychologist, forensic, que generalmente pueden aliviarse con actividades de Systems developer, caminatas, cualquier tipo de ejercicio, charlas sobre los problemas con amigos o simplemente con dormir bien por las noches.  Es mucho ms difcil Engineer, maintenance (IT) crnico (a largo plazo, continuo), que  puede ser perjudicial desde el punto de vista psicolgico y Architectural technologist, y daino tanto para la persona como para los amigos y Financial risk analyst. [place] personas reaccionan de maneras diferentes al estrs. Hay algunos efectos frecuentes que nos ayudan a Tour manager. En momentos de estrs extremo, las personas:  Pueden temblar de [place] incontrolable.  Pueden respirar con mayor rapidez y profundidad de lo normal (hiperventilar).  Pueden vomitar.  En el caso de los asmticos, el estrs puede desencadenar una crisis de asma.  En [employer], el estrs puede desencadenar cefaleas migraosas, lceras y dolor de cuerpo. LOS EFECTOS FSICOS DEL ESTRS PUEDEN INCLUIR LO SIGUIENTE:  Prdida de energa.  Problemas en la piel.  Dolores debido a la tensin muscular, entre ellos, dolor de cuello y [place], y Tax adviser.  Intensificacin del dolor debido a artritis y Media planner.  Latidos cardacos irregulares (palpitaciones).  Perodos de irritabilidad o enojo.  Apata o depresin.  Ansiedad (sentir nerviosismo o preocupacin).  Conducta fuera de lo comn.  Prdida del apetito.  Ingesta de comidas para calmar la angustia.  Falta de concentracin.  Prdida o disminucin del deseo sexual.  Aumento del consumo de tabaco, alcohol o drogas.  En el caso de las mujeres, falta del perodo menstrual.  lceras, dolor en las articulaciones y Marketing executive. El estrs postraumtico es aquel causado por cualquier accidente grave, dao emocional grave o experiencia extremadamente difcil o violenta, como una violacin o Fabienne Bruns. Las vctimas de estrs postraumtico pueden tener emociones mezcladas, como temor, vergenza, depresin, culpa o enojo. Tambin pueden sentirse atormentados por recuerdos o imgenes inquietantes. Estas emociones pueden durar semanas, meses o incluso aos despus del evento traumtico que las desencaden. Hay tratamientos especializados  disponibles, posiblemente  con medicamentos y terapias psicolgicas. Si el Brewing technologist sntomas fsicos, angustia intensa, o le dificulta el desempeo normal, vale la pena que consulte a su profesional. Es importante recordar que, aunque el estrs es [place] parte normal de la vida, el estrs extremo o prolongado puede causar otras enfermedades que requerirn [place]. Es mejor visitar a un mdico cuanto antes. Se ha asociado al estrs con el desarrollo de presin arterial alta y enfermedades cardacas, as como insomnio y depresin. No hay una prueba diagnstica para el estrs, ya que cada persona reacciona de manera diferente. Sin embargo, un mdico podr [employer] sntomas fsicos, por ejemplo:  Dolores de [place].  [place].  lceras. El estrs emocional, como la preocupacin profunda, el desnimo o la irritabilidad deben detectarse cuando el mdico hace las preguntas pertinentes para identificar los problemas subyacentes que podran ser la causa. En caso de que los motivos de los sntomas sean fsicos, es posible que el mdico tambin quiera hacer algunos estudios para Control and instrumentation engineer. Si cree que sufre de estrs, intente identificar los aspectos de su vida que lo estn causando. Algunas veces no puede cambiarlos ni evitarlos, pero incluso una modificacin pequea puede tener un efecto domin positivo. Un cambio sencillo en el estilo de vida puede marcar la diferencia. ESTRATEGIAS QUE PUEDEN AYUDAR A MANEJAR EL ESTRS:  Delegar o compartir las responsabilidades.  [place] confrontaciones.  Aprender a ser ms asertivo.  Hacer actividad fsica regularmente.  Evitar el consumo de alcohol o de drogas ilegales para sobrellevar la situacin.  Consumir una dieta sana y equilibrada, con alto contenido de frutas, verduras y protenas.  Encontrar el humor o el absurdo en las situaciones estresantes.  Nunca asumir ms responsabilidades que las que sabe que puede manejar con  comodidad.  Organizar mejor el tiempo para hacer lo ms posible.  Hablar con los amigos o la familia, y Publishing rights manager los pensamientos y los temores.  Escuchar msica o cintas de audio de relajacin.  Tcnicas de relajacin, como respiracin profunda, meditacin y yoga.  Tensionar y [employer], desde los dedos de los pies Librarian, academic a la cabeza y el cuello. Si cree que recibir ayuda sera til, ya sea para identificar los factores que lo estresan o para aprender las tcnicas que lo ayuden a [place], visite a un mdico capaz de ayudarlo. En lugar de depender de los medicamentos, siempre es mejor tratar de identificar los aspectos de su vida que le provocan estrs y aprender a Licensed conveyancer. Hay muchas tcnicas para controlar el estrs, entre ellas, el asesoramiento, la psicoterapia, la [place], el yoga y Adult nurse. Un mdico puede ayudarlo a Teacher, adult education qu es lo mejor para usted. Document Released: 03/30/2013 Document Revised: 06/14/2013 [facility] Patient Information 2015 [place]. This information is not intended to replace advice given to you by your health care provider. Make sure you discuss any questions you have with your health care provider.

## 2014-08-02 ENCOUNTER — Ambulatory Visit: Payer: Self-pay | Attending: Internal Medicine | Admitting: Physician Assistant

## 2014-08-02 VITALS — BP 95/61 | HR 93 | Temp 98.6°F

## 2014-08-02 DIAGNOSIS — A084 Viral intestinal infection, unspecified: Secondary | ICD-10-CM | POA: Insufficient documentation

## 2014-08-02 MED ORDER — DIPHENOXYLATE-ATROPINE 2.5-0.025 MG PO TABS: 1.0000 | ORAL_TABLET | Freq: Four times a day (QID) | ORAL | Status: AC | PRN

## 2014-08-02 MED ORDER — PROMETHAZINE HCL 12.5 MG PO TABS: 12.5000 mg | ORAL_TABLET | Freq: Three times a day (TID) | ORAL | Status: AC | PRN

## 2014-08-02 NOTE — Progress Notes (Signed)
Pt presents to clinic with c/o abdominal pain, onset yesterday after eating lunch, associated with nausea/vomiting/diarrhea, and heart burn. Pt also reports chill, no fever.

## 2014-08-02 NOTE — Progress Notes (Signed)
Chief Complaint: My stomach hurts and I been vomiting and having diarrhea  Subjective: 44 year old Hispanic female presents with abdominal pain nausea, vomiting, and diarrhea since last p.m. She states she had a normal day on yesterday. She has not eaten out at any restaurants. Her diet has not changed. She suddenly had abdominal pain followed by 4 episodes of emesis. She also had at least 3 loose stools. She has not had any recent travel. She doesn't live with any small children. There have been no other sick contacts.   ROS:  GEN: denies fever or chills, denies change in weight Skin: denies lesions or rashes HEENT: denies headache, earache, epistaxis, sore throat, or neck pain LUNGS: denies SHOB, dyspnea, PND, orthopnea CV: denies CP or palpitations ABD: denies abd pain, N or V EXT: denies muscle spasms or swelling; no pain in lower ext, no weakness NEURO: denies numbness or tingling, denies sz, stroke or TIA   Objective:  Filed Vitals:   08/02/14 1108  BP: 95/61  Pulse: 93  Temp: 98.6 F (37 C)    Physical Exam:  General: in no acute distress. HEENT: no pallor, no icterus, moist oral mucosa, no JVD, no lymphadenopathy Heart: Normal  s1 &s2  Regular rate and rhythm, without murmurs, rubs, gallops. Lungs: Clear to auscultation bilaterally. Abdomen: Soft, nontender, nondistended, positive bowel sounds. Extremities: No clubbing cyanosis or edema with positive pedal pulses. Neuro: Alert, awake, oriented x3, nonfocal.  Pertinent Lab Results:none   Medications: Prior to Admission medications   Medication Sig Start Date End Date Taking? Authorizing Provider  ALPRAZolam Prudy Feeler(XANAX) 0.5 MG tablet Take 1 tablet (0.5 mg total) by mouth 2 (two) times daily as needed for anxiety. 06/01/14   Ambrose FinlandValerie A Keck, NP  diphenoxylate-atropine (LOMOTIL) 2.5-0.025 MG per tablet Take 1 tablet by mouth 4 (four) times daily as needed for diarrhea or loose stools. 08/02/14   Vivianne Masteriffany S Leslea Vowles, PA-C   ibuprofen (ADVIL,MOTRIN) 600 MG tablet Take 1 tablet (600 mg total) by mouth every 6 (six) hours as needed. Patient not taking: Reported on 06/01/2014 06/06/13   Adam PhenixJames G Arnold, MD  Multiple Vitamin (MULTIVITAMIN WITH MINERALS) TABS tablet Take 1 tablet by mouth daily.    Historical Provider, MD  promethazine (PHENERGAN) 12.5 MG tablet Take 1 tablet (12.5 mg total) by mouth every 8 (eight) hours as needed for nausea or vomiting. 08/02/14   Vivianne Masteriffany S Keia Rask, PA-C    Assessment: 1. Viral gastroenteritis  Plan: CBC and CMP Lomotil for diarrhea Phenergan for nausea Watch for fevers If no relief by Friday, please call us back for further instruction  Follow up: As scheduled  The patient was given clear instructions to go to ER or return to medical center if symptoms don't improve, worsen or new problems develop. The patient verbalized understanding. The patient was told to call to get lab results if they haven't heard anything in the next week.   This note has been created with Education officer, environmentalDragon speech recognition software and smart phrase technology. Any transcriptional errors are unintentional.   Scot Juniffany Dylon Correa, PA-C 08/02/2014, 11:25 AM

## 2014-08-03 LAB — CBC WITH DIFFERENTIAL/PLATELET
BASOS ABS: 0 10*3/uL (ref 0.0–0.1)
Basophils Relative: 0 % (ref 0–1)
Eosinophils Absolute: 0 10*3/uL (ref 0.0–0.7)
Eosinophils Relative: 0 % (ref 0–5)
HCT: 42.1 % (ref 36.0–46.0)
Hemoglobin: 14 g/dL (ref 12.0–15.0)
LYMPHS PCT: 7 % — AB (ref 12–46)
Lymphs Abs: 0.7 10*3/uL (ref 0.7–4.0)
MCH: 31.9 pg (ref 26.0–34.0)
MCHC: 33.3 g/dL (ref 30.0–36.0)
MCV: 95.9 fL (ref 78.0–100.0)
MONO ABS: 0.3 10*3/uL (ref 0.1–1.0)
MPV: 12.6 fL — AB (ref 8.6–12.4)
Monocytes Relative: 3 % (ref 3–12)
NEUTROS ABS: 8.6 10*3/uL — AB (ref 1.7–7.7)
NEUTROS PCT: 90 % — AB (ref 43–77)
Platelets: 91 10*3/uL — ABNORMAL LOW (ref 150–400)
RBC: 4.39 MIL/uL (ref 3.87–5.11)
RDW: 13.9 % (ref 11.5–15.5)
WBC: 9.5 10*3/uL (ref 4.0–10.5)

## 2014-08-03 LAB — COMPLETE METABOLIC PANEL WITH GFR
ALBUMIN: 4.8 g/dL (ref 3.5–5.2)
ALT: 37 U/L — AB (ref 0–35)
AST: 31 U/L (ref 0–37)
Alkaline Phosphatase: 98 U/L (ref 39–117)
BUN: 17 mg/dL (ref 6–23)
CALCIUM: 9.2 mg/dL (ref 8.4–10.5)
CHLORIDE: 103 meq/L (ref 96–112)
CO2: 27 mEq/L (ref 19–32)
Creat: 0.73 mg/dL (ref 0.50–1.10)
GFR, Est African American: >89 mL/min
GFR, Est Non African American: >89 mL/min
Glucose, Bld: 90 mg/dL (ref 70–99)
POTASSIUM: 4.5 meq/L (ref 3.5–5.3)
SODIUM: 140 meq/L (ref 135–145)
Total Bilirubin: 1.6 mg/dL — ABNORMAL HIGH (ref 0.2–1.2)
Total Protein: 7.3 g/dL (ref 6.0–8.3)

## 2014-08-10 ENCOUNTER — Encounter: Payer: Self-pay | Admitting: Internal Medicine

## 2014-08-10 ENCOUNTER — Ambulatory Visit: Payer: Self-pay | Attending: Internal Medicine | Admitting: Internal Medicine

## 2014-08-10 VITALS — BP 106/69 | HR 93 | Temp 98.2°F | Resp 16 | Ht 65.0 in | Wt 156.0 lb

## 2014-08-10 DIAGNOSIS — J309 Allergic rhinitis, unspecified: Secondary | ICD-10-CM | POA: Insufficient documentation

## 2014-08-10 DIAGNOSIS — J029 Acute pharyngitis, unspecified: Secondary | ICD-10-CM | POA: Insufficient documentation

## 2014-08-10 DIAGNOSIS — F419 Anxiety disorder, unspecified: Secondary | ICD-10-CM | POA: Insufficient documentation

## 2014-08-10 DIAGNOSIS — Z23 Encounter for immunization: Secondary | ICD-10-CM

## 2014-08-10 LAB — POCT RAPID STREP A (OFFICE): RAPID STREP A SCREEN: NEGATIVE

## 2014-08-10 MED ORDER — FLUTICASONE PROPIONATE 50 MCG/ACT NA SUSP: 2.0000 | Freq: Every day | NASAL | Status: AC

## 2014-08-10 MED ORDER — CETIRIZINE HCL 10 MG PO TABS: 10.0000 mg | ORAL_TABLET | Freq: Every day | ORAL | Status: AC

## 2014-08-10 NOTE — Patient Instructions (Signed)
Rinitis alrgica (Allergic Rhinitis) La rinitis alrgica ocurre cuando las membranas mucosas de la nariz responden a los alrgenos. Los alrgenos son las partculas que estn en el aire y que hacen que el cuerpo tenga una reaccin alrgica. Esto hace que usted libere anticuerpos alrgicos. A travs de una cadena de eventos, estos finalmente hacen que usted libere histamina en la corriente sangunea. Aunque la funcin de la histamina es proteger al organismo, es esta liberacin de histamina lo que provoca malestar, como los estornudos frecuentes, la congestin y goteo y picazn nasales.  CAUSAS  La causa de la rinitis alrgica estacional (fiebre del heno) son los alrgenos del polen que pueden provenir del csped, los rboles y la maleza. La causa de la rinitis alrgica permanente (rinitis alrgica perenne) son los alrgenos como los caros del polvo domstico, la caspa de las mascotas y las esporas del moho.  SNTOMAS   Secrecin nasal (congestin).  Goteo y picazn nasales con estornudos y lagrimeo. DIAGNSTICO  Su mdico puede ayudarlo a determinar el alrgeno o los alrgenos que desencadenan sus sntomas. Si usted y su mdico no pueden determinar cul es el alrgeno, pueden hacerse anlisis de sangre o estudios de la piel. TRATAMIENTO  La rinitis alrgica no tiene cura, pero puede controlarse mediante lo siguiente:  Medicamentos y vacunas contra la alergia (inmunoterapia).  Prevencin del alrgeno. La fiebre del heno a menudo puede tratarse con antihistamnicos en las formas de pldoras o aerosol nasal. Los antihistamnicos bloquean los efectos de la histamina. Existen medicamentos de venta libre que pueden ayudar con la congestin nasal y la hinchazn alrededor de los ojos. Consulte a su mdico antes de tomar o administrarse este medicamento.  Si la prevencin del alrgeno o el medicamento recetado no dan resultado, existen muchos medicamentos nuevos que su mdico puede recetarle. Pueden  usarse medicamentos ms fuertes si las medidas iniciales no son efectivas. Pueden aplicarse inyecciones desensibilizantes si los medicamentos y la prevencin no funcionan. La desensibilizacin ocurre cuando un paciente recibe vacunas constantes hasta que el cuerpo se vuelve menos sensible al alrgeno. Asegrese de realizar un seguimiento con su mdico si los problemas continan. INSTRUCCIONES PARA EL CUIDADO EN EL HOGAR No es posible evitar por completo los alrgenos, pero puede reducir los sntomas al tomar medidas para limitar su exposicin a ellos. Es muy til saber exactamente a qu es alrgico para que pueda evitar sus desencadenantes especficos. SOLICITE ATENCIN MDICA SI:   Tiene fiebre.  Desarrolla una tos que no se detiene fcilmente (persistente).  Le falta el aire.  Comienza a tener sibilancias.  Los sntomas interfieren con las actividades diarias normales. Document Released: 03/19/2005 Document Revised: 03/30/2013 ExitCare Patient Information 2015 [employer]. This information is not intended to replace advice given to you by your health care provider. Make sure you discuss any questions you have with your health care provider. 

## 2014-08-10 NOTE — Progress Notes (Signed)
Patient ID: Jamie Friedman, female   DOB: 02-06-1971, 44 y.o.   MRN: [id number]  CC: anxiety, sore throat   HPI: Jamie Friedman is a 44 y.o. female here today for a follow up visit.  Patient has past medical history of anxiety.  She presents to clinic today for a follow up of anxiety.  She states that she has been more relaxed since our last visit and has only had to use the xanax a few times.  Patient has concerns today of sore throat for the past two months.  She has white phlegm, dry throat, itchy/dry eyes, rhinitis.  She denies fever, chills, cough, headaches.   Patient has No headache, No chest pain, No abdominal pain - No Nausea, No new weakness tingling or numbness, No Cough - SOB.  No Known Allergies History reviewed. No pertinent past medical history. Current Outpatient Prescriptions on File Prior to Visit  Medication Sig Dispense Refill  . ALPRAZolam (XANAX) 0.5 MG tablet Take 1 tablet (0.5 mg total) by mouth 2 (two) times daily as needed for anxiety. (Patient not taking: Reported on 08/10/2014) 30 tablet 0  . diphenoxylate-atropine (LOMOTIL) 2.5-0.025 MG per tablet Take 1 tablet by mouth 4 (four) times daily as needed for diarrhea or loose stools. (Patient not taking: Reported on 08/10/2014) 20 tablet 0  . ibuprofen (ADVIL,MOTRIN) 600 MG tablet Take 1 tablet (600 mg total) by mouth every 6 (six) hours as needed. (Patient not taking: Reported on 06/01/2014) 30 tablet 1  . Multiple Vitamin (MULTIVITAMIN WITH MINERALS) TABS tablet Take 1 tablet by mouth daily.    . promethazine (PHENERGAN) 12.5 MG tablet Take 1 tablet (12.5 mg total) by mouth every 8 (eight) hours as needed for nausea or vomiting. (Patient not taking: Reported on 08/10/2014) 20 tablet 0   No current facility-administered medications on file prior to visit.   History reviewed. No pertinent family history. History   Social History  . Marital Status: Single    Spouse Name: N/A  . Number of Children: N/A   . Years of Education: N/A   Occupational History  . Not on file.   Social History Main Topics  . Smoking status: Never Smoker   . Smokeless tobacco: Not on file  . Alcohol Use: No  . Drug Use: No  . Sexual Activity: Yes    Birth Control/ Protection: Condom   Other Topics Concern  . Not on file   Social History Narrative    Review of Systems: See HPI   Objective:   Filed Vitals:   08/10/14 1210  BP: 106/69  Pulse: 93  Temp: 98.2 F (36.8 C)  Resp: 16    Physical Exam  Constitutional: She is oriented to person, place, and time.  HENT:  Right Ear: External ear normal.  Left Ear: External ear normal.  Mouth/Throat: No oropharyngeal exudate.  Erythematous pharynx Swollen bilateral nasal turbinates  Cardiovascular: Normal rate, regular rhythm and normal heart sounds.   Pulmonary/Chest: Effort normal and breath sounds normal.  Neurological: She is alert and oriented to person, place, and time.  Skin: Skin is warm and dry.     Lab Results  Component Value Date   WBC 9.5 08/02/2014   HGB 14.0 08/02/2014   HCT 42.1 08/02/2014   MCV 95.9 08/02/2014   PLT 91* 08/02/2014   Lab Results  Component Value Date   CREATININE 0.73 08/02/2014   BUN 17 08/02/2014   NA 140 08/02/2014   K 4.5 08/02/2014   CL  103 08/02/2014   CO2 27 08/02/2014    Lab Results  Component Value Date   HGBA1C 5.6 02/06/2014   Lipid Panel     Component Value Date/Time   CHOL 205* 02/06/2014 0933   TRIG 145 02/06/2014 0933   HDL 59 02/06/2014 0933   CHOLHDL 3.5 02/06/2014 0933   VLDL 29 02/06/2014 0933   LDLCALC 117* 02/06/2014 0933       Assessment and plan:   Jamie Friedman was seen today for follow-up, sore throat and anxiety.  Diagnoses and all orders for this visit:  Sore throat Orders: -     Rapid Strep A -     Throat culture Loney Loh)  Allergic rhinitis, unspecified allergic rhinitis type Orders: -     fluticasone (FLONASE) 50 MCG/ACT nasal spray; Place 2 sprays into  both nostrils daily. -     cetirizine (ZYRTEC) 10 MG tablet; Take 1 tablet (10 mg total) by mouth daily.  Due to language barrier, an interpreter was present during the history-taking and subsequent discussion (and for part of the physical exam) with this patient.  Return if symptoms worsen or fail to improve.       Holland Commons, NP-C [facility] [phone] 08/10/2014, 12:13 PM

## 2014-08-10 NOTE — Progress Notes (Signed)
Pt comes in with c/o sore throat, pain with swallowing x 2 mnths States her throat feels dry, redness noted tonsils C/o anxiety, taking Xanax as needed Rapid strep obtained Need flu vaccine In house Spanish interpretor present

## 2014-08-12 LAB — CULTURE, GROUP A STREP: ORGANISM ID, BACTERIA: NORMAL

## 2014-08-17 ENCOUNTER — Telehealth: Payer: Self-pay | Admitting: *Deleted

## 2014-08-17 NOTE — Telephone Encounter (Signed)
-----   Message from Ambrose FinlandValerie A Keck, NP sent at 08/14/2014 10:38 AM EST ----- Normal throat culture, no strep

## 2014-08-17 NOTE — Telephone Encounter (Signed)
Pt aware (information given in Spanish)

## 2014-12-11 ENCOUNTER — Ambulatory Visit: Payer: Self-pay | Admitting: Internal Medicine

## 2014-12-11 ENCOUNTER — Ambulatory Visit: Payer: Self-pay | Attending: Internal Medicine

## 2015-09-04 ENCOUNTER — Ambulatory Visit: Payer: Self-pay | Admitting: Internal Medicine

## 2015-09-12 ENCOUNTER — Ambulatory Visit: Payer: Self-pay | Admitting: Internal Medicine

## 2019-05-06 ENCOUNTER — Other Ambulatory Visit: Payer: Self-pay

## 2019-05-06 DIAGNOSIS — Z20822 Contact with and (suspected) exposure to covid-19: Secondary | ICD-10-CM

## 2019-05-09 LAB — NOVEL CORONAVIRUS, NAA: SARS-CoV-2, NAA: NOT DETECTED

## 2020-02-08 ENCOUNTER — Ambulatory Visit: Payer: Self-pay | Admitting: Family Medicine
